# Patient Record
Sex: Female | Born: 1995
Health system: Southern US, Community
[De-identification: ages and names within clinical notes are randomized; demographics above are authoritative.]

## PROBLEM LIST (undated history)

## (undated) ENCOUNTER — Emergency Department (HOSPITAL_COMMUNITY): Discharge status: Absent without leave | Payer: Medicaid Other

## (undated) DIAGNOSIS — Z309 Encounter for contraceptive management, unspecified: Secondary | ICD-10-CM

## (undated) DIAGNOSIS — T7840XA Allergy, unspecified, initial encounter: Secondary | ICD-10-CM

## (undated) DIAGNOSIS — B999 Unspecified infectious disease: Secondary | ICD-10-CM

## (undated) HISTORY — DX: Encounter for contraceptive management, unspecified: Z30.9

## (undated) HISTORY — PX: WISDOM TOOTH EXTRACTION: SHX21

## (undated) HISTORY — DX: Allergy, unspecified, initial encounter: T78.40XA

---
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
## 2016-02-14 ENCOUNTER — Emergency Department (HOSPITAL_COMMUNITY)
Admission: EM | Admit: 2016-02-14 | Discharge: 2016-02-14 | Disposition: A | Discharge status: Home / Self Care | Payer: Self-pay | Attending: Emergency Medicine | Admitting: Emergency Medicine

## 2016-02-14 ENCOUNTER — Emergency Department (HOSPITAL_COMMUNITY): Payer: Self-pay

## 2016-02-14 ENCOUNTER — Telehealth: Payer: Self-pay | Admitting: Pediatrics

## 2016-02-14 ENCOUNTER — Encounter (HOSPITAL_COMMUNITY): Payer: Self-pay | Admitting: Emergency Medicine

## 2016-02-14 ENCOUNTER — Other Ambulatory Visit: Payer: Self-pay | Admitting: Pediatrics

## 2016-02-14 DIAGNOSIS — Z91010 Allergy to peanuts: Secondary | ICD-10-CM | POA: Insufficient documentation

## 2016-02-14 DIAGNOSIS — Z79899 Other long term (current) drug therapy: Secondary | ICD-10-CM | POA: Insufficient documentation

## 2016-02-14 DIAGNOSIS — J45901 Unspecified asthma with (acute) exacerbation: Secondary | ICD-10-CM | POA: Insufficient documentation

## 2016-02-14 DIAGNOSIS — Z87891 Personal history of nicotine dependence: Secondary | ICD-10-CM | POA: Insufficient documentation

## 2016-02-14 MED ORDER — PREDNISONE 20 MG PO TABS: 40.0000 mg | ORAL_TABLET | Freq: Every day | ORAL | 0 refills | Status: DC

## 2016-02-14 MED ORDER — ALBUTEROL SULFATE (2.5 MG/3ML) 0.083% IN NEBU
5.0000 mg | INHALATION_SOLUTION | Freq: Once | RESPIRATORY_TRACT | Status: AC
  Administered 2016-02-14: 5 mg via RESPIRATORY_TRACT
  Filled 2016-02-14: qty 6

## 2016-02-14 MED ORDER — ALBUTEROL SULFATE HFA 108 (90 BASE) MCG/ACT IN AERS
2.0000 | INHALATION_SPRAY | RESPIRATORY_TRACT | Status: DC | PRN
  Administered 2016-02-14: 2 via RESPIRATORY_TRACT
  Filled 2016-02-14: qty 6.7

## 2016-02-14 MED ORDER — ALBUTEROL SULFATE HFA 108 (90 BASE) MCG/ACT IN AERS: 1.0000 | INHALATION_SPRAY | Freq: Four times a day (QID) | RESPIRATORY_TRACT | 0 refills | Status: DC | PRN

## 2016-02-14 MED ORDER — ALBUTEROL SULFATE (2.5 MG/3ML) 0.083% IN NEBU: 2.5000 mg | INHALATION_SOLUTION | Freq: Four times a day (QID) | RESPIRATORY_TRACT | 0 refills | Status: DC | PRN

## 2016-02-14 MED ORDER — IPRATROPIUM BROMIDE 0.02 % IN SOLN
0.5000 mg | Freq: Once | RESPIRATORY_TRACT | Status: AC
  Administered 2016-02-14: 0.5 mg via RESPIRATORY_TRACT
  Filled 2016-02-14: qty 2.5

## 2016-02-14 MED ORDER — PREDNISONE 10 MG PO TABS
60.0000 mg | ORAL_TABLET | Freq: Once | ORAL | Status: AC
  Administered 2016-02-14: 60 mg via ORAL
  Filled 2016-02-14: qty 1

## 2016-02-14 MED ORDER — IPRATROPIUM-ALBUTEROL 0.5-2.5 (3) MG/3ML IN SOLN
RESPIRATORY_TRACT | Status: AC
  Administered 2016-02-14: 3 mL
  Filled 2016-02-14: qty 3

## 2016-02-14 NOTE — Telephone Encounter (Signed)
Refill sent. Left message to advise pt.

## 2016-02-14 NOTE — Telephone Encounter (Signed)
Pt has not been seen since 02/2015. Please advise on refill?

## 2016-02-14 NOTE — Discharge Instructions (Signed)
Your xray was negative for pneumonia. I have given you a prescription for prednisone to take once a day for the next 4 days. Use albuterol inhaler as needed. Follow up with primary care doctor. Return to the ED for symptoms worsen.

## 2016-02-14 NOTE — ED Provider Notes (Signed)
[facility]-EMERGENCY DEPT Provider Note   CSN: [id number] Arrival date & time: 02/14/16  1323     History   Chief Complaint Chief Complaint  Patient presents with  . asthma exacerbation    HPI Elizabeth Stuart is a 20 y.o. female.  20 year old African-American female past medical history significant for asthma presents to the ED today with an asthma exacerbation. 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 States she felt her wheezing worsened since this morning. Patient states that she is using her inhaler at home because she ran out and has not been able to get a prescription refilled. She also reports a mild productive cough Along with nasal congestion and rhinorrhea. Several sick contacts in the house. She denies any fevers, chest pain, shortness of breath, abdominal pain, nausea, emesis, urinary symptoms, change in bowel habits, numbness/stealing.       Past Medical History:  Diagnosis Date  . Allergy   . Asthma   . Contraceptive management 01/07/2015    Patient Active Problem List   Diagnosis Date Noted  . Mild intermittent asthma 02/11/2015  . Allergic rhinitis due to pollen 02/11/2015  . Oral allergy syndrome 02/11/2015  . Contraceptive management 01/07/2015  . Dyspnea 12/24/2012  . Hypoxemia 12/24/2012  . Caregiver smokes indoors and in family car 12/24/2012  . Mild intermittent asthma with acute exacerbation in pediatric patient 12/24/2012    History reviewed. No pertinent surgical history.  OB History    Gravida Para Term Preterm AB Living   0 0 0 0 0 0   SAB TAB Ectopic Multiple Live Births   0 0 0 0         Home Medications    Prior to Admission medications   Medication Sig Start Date End Date Taking? Authorizing Provider  benzonatate (TESSALON) 100 MG capsule Take 1 capsule (100 mg total) by mouth every 8 (eight) hours. 06/22/15   [name],  MD  EPINEPHrine (EPIPEN IJ) Inject 0.3 mg as directed as needed (FOR SEVERE LIFE-THREATENING ALLERGIC REACTION). Reported on 05/01/2015    Historical Provider, MD  fluticasone (FLONASE) 50 MCG/ACT nasal spray Place 1 spray into both nostrils daily.    Historical Provider, MD  ibuprofen (ADVIL,MOTRIN) 800 MG tablet Take 1 tablet (800 mg total) by mouth 3 (three) times daily. 08/13/15   Tammy Triplett, PA-C  magic mouthwash w/lidocaine SOLN 5 ml po swish and spit, do not swallow 08/13/15   Tammy Triplett, PA-C  montelukast (SINGULAIR) 10 MG tablet Take 10 mg by mouth daily.     Historical Provider, MD  norgestrel-ethinyl estradiol (LO/OVRAL,CRYSELLE) 0.3-30 MG-MCG tablet Take 1 tablet by mouth at bedtime.    Historical Provider, MD  predniSONE (DELTASONE) 20 MG tablet Take 2 tablets (40 mg total) by mouth daily. 06/22/15   [name], MD  PROAIR HFA 108 [phone] Base) MCG/ACT inhaler INHALE TWO PUFFS BY MOUTH EVERY 4 TO 6 HOURS AS NEEDED FOR COUGH OR WHEEZING 10/21/15   Fletcher Anon, MD  pseudoephedrine (SUDAFED) 60 MG tablet Take 1 tablet (60 mg total) by mouth every 6 (six) hours as needed for congestion. 08/13/15   Tammy Triplett, PA-C    Family History Family History  Problem Relation Age of Onset  . Diabetes Sister   . Diabetes Brother   . Asthma Brother   . Cancer Father     colon,  lung  . Hypertension Maternal Grandmother   . Diabetes Maternal Grandmother   . Arthritis Maternal Grandmother   . Hypertension Maternal Grandfather     Social History Social History  Substance Use Topics  . Smoking status: Former Smoker    Types: Cigars    Quit date: 01/06/2015  . Smokeless tobacco: Never Used  . Alcohol use No     Allergies   Dust mite extract; Other; Peanuts [peanut oil]; and Tomato   Review of Systems Review of Systems  Constitutional: Negative for chills and fever.  HENT: Positive for congestion, sinus pain, sinus pressure and sore throat.   Eyes: Negative.   Respiratory:  Positive for cough and wheezing. Negative for shortness of breath.   Cardiovascular: Negative for chest pain and palpitations.  Gastrointestinal: Negative for abdominal pain.  Musculoskeletal: Negative.   Skin: Negative.   Neurological: Negative for headaches.  All other systems reviewed and are negative.    Physical Exam Updated Vital Signs BP 98/81 (BP Location: Right Arm)   Pulse 89   Temp 98.3 F (36.8 C) (Oral)   Resp 20   Ht 5\' 3"  (1.6 m)   Wt 83.9 kg   LMP 02/10/2016   SpO2 98%   BMI 32.77 kg/m   Physical Exam  Constitutional: She is oriented to person, place, and time. She appears well-developed and well-nourished. No distress.  HENT:  Head: Normocephalic and atraumatic.  Right Ear: Tympanic membrane, external ear and ear canal normal.  Left Ear: Tympanic membrane, external ear and ear canal normal.  Nose: Mucosal edema and rhinorrhea present.  Mouth/Throat: Uvula is midline, oropharynx is clear and moist and mucous membranes are normal.  Eyes: Conjunctivae are normal. Right eye exhibits no discharge. Left eye exhibits no discharge. No scleral icterus.  Neck: Normal range of motion. Neck supple. No thyromegaly present.  Cardiovascular: Normal rate, regular rhythm, normal heart sounds and intact distal pulses.  Exam reveals no gallop and no friction rub.   No murmur heard. Pulmonary/Chest: Effort normal. No accessory muscle usage. No tachypnea. No respiratory distress. She has decreased breath sounds. She has wheezes. She has no rhonchi. She has no rales.  The patient is not hypoxic.  Abdominal: Soft. Bowel sounds are normal. She exhibits no distension. There is no tenderness. There is rebound and guarding.  Musculoskeletal: Normal range of motion.  Lymphadenopathy:    She has no cervical adenopathy.  Neurological: She is alert and oriented to person, place, and time.  Skin: Skin is warm and dry. Capillary refill takes less than 2 seconds.  Nursing note and vitals  reviewed.    ED Treatments / Results  Labs (all labs ordered are listed, but only abnormal results are displayed) Labs Reviewed - No data to display  EKG  EKG Interpretation None       Radiology No results found.  Procedures Procedures (including critical care time)  Medications Ordered in ED Medications  albuterol (PROVENTIL) (2.5 MG/3ML) 0.083% nebulizer solution 5 mg (5 mg Nebulization Given 02/14/16 1425)  ipratropium (ATROVENT) nebulizer solution 0.5 mg (0.5 mg Nebulization Given 02/14/16 1425)  ipratropium-albuterol (DUONEB) 0.5-2.5 (3) MG/3ML nebulizer solution (3 mLs  Given 02/14/16 1348)  predniSONE (DELTASONE) tablet 60 mg (60 mg Oral Given 02/14/16 1405)     Initial Impression / Assessment and Plan / ED Course  I have reviewed the triage vital signs and the nursing notes.  Pertinent labs & imaging results that were available during my care of the patient were reviewed  by me and considered in my medical decision making (see chart for details).  Clinical Course   Patient ambulated in ED with O2 saturations maintained >90, no current signs of respiratory distress. Lung exam improved after nebulizer treatment. Prednisone given in the ED and pt will bd dc with 5 day burst. Pt states they are breathing at baseline. Chest xray without acute infiltrate. Pt has been instructed to continue using prescribed medications and to speak with PCP about today's exacerbation. She was given prescription for home meds. Pt is hemodynamically stable, in NAD, & able to ambulate in the ED. Pt has no complaints prior to dc. Pt is comfortable with above plan and is stable for discharge at this time. All questions were answered prior to disposition. Strict return precautions for f/u to the ED were discussed.   Final Clinical Impressions(s) / ED Diagnoses   Final diagnoses:  Moderate asthma with exacerbation, unspecified whether persistent    New Prescriptions Discharge Medication List  as of 02/14/2016  3:17 PM    START taking these medications   Details  albuterol (PROVENTIL) (2.5 MG/3ML) 0.083% nebulizer solution Take 3 mLs (2.5 mg total) by nebulization every 6 (six) hours as needed for wheezing or shortness of breath., Starting Fri 02/14/2016, Print    predniSONE (DELTASONE) 20 MG tablet Take 2 tablets (40 mg total) by mouth daily with breakfast., Starting Fri 02/14/2016, Print         Rise MuKenneth T Syrenity Klepacki, PA-C 02/17/16 1101    Heide Scaleshristopher J Tegeler, MD 02/17/16 2204

## 2016-02-14 NOTE — ED Triage Notes (Signed)
Pt reports hx of asthma, onset of symptoms this am. Pt has ins and exp wheezing VSS. Pt reports she has not used any medications PTA, [facility]

## 2016-02-14 NOTE — Telephone Encounter (Signed)
She needs a refill sent in for her Proair Inhaler. [place] [facility] [place]. I told her that she needed an appointment but she says she cant schedule an appointment right now as she no longer has insurance and cant afford to pay for the visit out of pocket. Is there anything that can be done? Please contact her

## 2016-02-14 NOTE — Telephone Encounter (Signed)
Give her 1 refill

## 2016-05-29 ENCOUNTER — Emergency Department (HOSPITAL_COMMUNITY)
Admission: EM | Admit: 2016-05-29 | Discharge: 2016-05-29 | Disposition: A | Discharge status: Home / Self Care | Payer: Medicaid Other | Attending: Emergency Medicine | Admitting: Emergency Medicine

## 2016-05-29 ENCOUNTER — Encounter (HOSPITAL_COMMUNITY): Payer: Self-pay

## 2016-05-29 DIAGNOSIS — J45909 Unspecified asthma, uncomplicated: Secondary | ICD-10-CM | POA: Insufficient documentation

## 2016-05-29 DIAGNOSIS — R079 Chest pain, unspecified: Secondary | ICD-10-CM | POA: Insufficient documentation

## 2016-05-29 DIAGNOSIS — Z87891 Personal history of nicotine dependence: Secondary | ICD-10-CM | POA: Insufficient documentation

## 2016-05-29 DIAGNOSIS — Z5321 Procedure and treatment not carried out due to patient leaving prior to being seen by health care provider: Secondary | ICD-10-CM | POA: Insufficient documentation

## 2016-05-29 DIAGNOSIS — Z79899 Other long term (current) drug therapy: Secondary | ICD-10-CM | POA: Insufficient documentation

## 2016-05-29 NOTE — ED Triage Notes (Signed)
Pt had just drank an energy drink when she had onset of central chest pains that happened for 10 minutes x 4 episodes, denies pain at this time.

## 2016-05-29 NOTE — ED Provider Notes (Signed)
  No one in room 05:25 AM   EKG Interpretation  Date/Time:  Friday May 29 2016 02:14:52 EST Ventricular Rate:  74 PR Interval:    QRS Duration: 81 QT Interval:  352 QTC Calculation: 391 R Axis:   94 Text Interpretation:  Sinus rhythm Borderline right axis deviation Low voltage, precordial leads Since last tracing rate slower 04 Jul 2014 Confirmed by [facility] [id number]  MD-I, Harrell Niehoff ([id number]) on 05/29/2016 2:54:23 AM         Devoria AlbeIva Giordano Getman, MD 05/29/16 [phone]

## 2016-05-29 NOTE — ED Notes (Signed)
Pt not in room and clothing is gone.

## 2016-06-19 ENCOUNTER — Encounter (HOSPITAL_COMMUNITY): Payer: Self-pay

## 2016-06-19 ENCOUNTER — Emergency Department (HOSPITAL_COMMUNITY)
Admission: EM | Admit: 2016-06-19 | Discharge: 2016-06-19 | Disposition: A | Discharge status: Home / Self Care | Payer: Medicaid Other | Attending: Emergency Medicine | Admitting: Emergency Medicine

## 2016-06-19 DIAGNOSIS — Z79899 Other long term (current) drug therapy: Secondary | ICD-10-CM | POA: Insufficient documentation

## 2016-06-19 DIAGNOSIS — Z87891 Personal history of nicotine dependence: Secondary | ICD-10-CM | POA: Insufficient documentation

## 2016-06-19 DIAGNOSIS — J45909 Unspecified asthma, uncomplicated: Secondary | ICD-10-CM | POA: Insufficient documentation

## 2016-06-19 DIAGNOSIS — J020 Streptococcal pharyngitis: Secondary | ICD-10-CM

## 2016-06-19 LAB — RAPID STREP SCREEN (MED CTR MEBANE ONLY): STREPTOCOCCUS, GROUP A SCREEN (DIRECT): POSITIVE — AB

## 2016-06-19 MED ORDER — PENICILLIN G BENZATHINE 1200000 UNIT/2ML IM SUSP
1.2000 10*6.[IU] | Freq: Once | INTRAMUSCULAR | Status: AC
  Administered 2016-06-19: 1.2 10*6.[IU] via INTRAMUSCULAR
  Filled 2016-06-19: qty 2

## 2016-06-19 NOTE — ED Triage Notes (Signed)
Sore throat x 2 days, swollen lymph nodes in neck.

## 2016-06-19 NOTE — Discharge Instructions (Signed)
Keep yourself hydrated. Follow up with your doctor. Use tylenol or ibuprofen as needed for pain and fever. Return to the ED if you develop new or worsening symptoms.

## 2016-06-19 NOTE — ED Provider Notes (Signed)
[facility]-EMERGENCY DEPT Provider Note   CSN: [id number] Arrival date & time: 06/19/16  0355     History   Chief Complaint Chief Complaint  Patient presents with  . Sore Throat    HPI Elizabeth Stuart is a 21 y.o. female.  Patient presents with a 2 day history of sore throat and pain with swallowing. Also feel swelling in her neck. Denies fever. Sick contacts with strep at home. No nausea or vomiting. Does have history of asthma. Denies any cough or fever. No abdominal pain, nausea, vomiting or diarrhea. No chest pain or shortness of breath. Good by mouth intake and urine output.   The history is provided by the patient.  Sore Throat  Pertinent negatives include no chest pain, no abdominal pain, no headaches and no shortness of breath.    Past Medical History:  Diagnosis Date  . Allergy   . Asthma   . Contraceptive management 01/07/2015    Patient Active Problem List   Diagnosis Date Noted  . Mild intermittent asthma 02/11/2015  . Allergic rhinitis due to pollen 02/11/2015  . Oral allergy syndrome 02/11/2015  . Contraceptive management 01/07/2015  . Dyspnea 12/24/2012  . Hypoxemia 12/24/2012  . Caregiver smokes indoors and in family car 12/24/2012  . Mild intermittent asthma with acute exacerbation in pediatric patient 12/24/2012    History reviewed. No pertinent surgical history.  OB History    Gravida Para Term Preterm AB Living   0 0 0 0 0 0   SAB TAB Ectopic Multiple Live Births   0 0 0 0         Home Medications    Prior to Admission medications   Medication Sig Start Date End Date Taking? Authorizing Provider  albuterol (PROAIR HFA) 108 (90 Base) MCG/ACT inhaler Inhale 1-2 puffs into the lungs every 6 (six) hours as needed for wheezing or shortness of breath. 02/14/16  Yes Fletcher Anon, MD  albuterol (PROVENTIL) (2.5 MG/3ML) 0.083% nebulizer solution Take 3 mLs (2.5 mg total) by nebulization every 6 (six) hours as needed for wheezing or shortness of  breath. 02/14/16  Yes Iantha Fallen T Leaphart, PA-C  EPINEPHrine (EPIPEN IJ) Inject 0.3 mg as directed as needed (FOR SEVERE LIFE-THREATENING ALLERGIC REACTION). Reported on 05/01/2015   Yes Historical Provider, MD  fluticasone (FLONASE) 50 MCG/ACT nasal spray Place 1 spray into both nostrils daily.   Yes Historical Provider, MD  predniSONE (DELTASONE) 20 MG tablet Take 2 tablets (40 mg total) by mouth daily with breakfast. 02/14/16   Rise Mu, PA-C    Family History Family History  Problem Relation Age of Onset  . Diabetes Sister   . Diabetes Brother   . Asthma Brother   . Cancer Father     colon, lung  . Hypertension Maternal Grandmother   . Diabetes Maternal Grandmother   . Arthritis Maternal Grandmother   . Hypertension Maternal Grandfather     Social History Social History  Substance Use Topics  . Smoking status: Former Smoker    Types: Cigars    Quit date: 01/06/2015  . Smokeless tobacco: Never Used  . Alcohol use No     Allergies   Dust mite extract; Other; Peanuts [peanut oil]; and Tomato   Review of Systems Review of Systems  Constitutional: Negative for activity change, appetite change, chills and fever.  HENT: Positive for sore throat. Negative for congestion, facial swelling and trouble swallowing.   Respiratory: Negative for cough and shortness of breath.  Cardiovascular: Negative for chest pain.  Gastrointestinal: Negative for abdominal pain, nausea and vomiting.  Genitourinary: Negative for dysuria and hematuria.  Musculoskeletal: Negative for arthralgias and myalgias.  Neurological: Negative for dizziness, weakness, light-headedness and headaches.   A complete 10 system review of systems was obtained and all systems are negative except as noted in the HPI and PMH.    Physical Exam Updated Vital Signs BP 132/77 (BP Location: Right Arm)   Pulse 81   Temp 98.3 F (36.8 C) (Oral)   Resp 17   Ht 5\' 4"  (1.626 m)   Wt 185 lb (83.9 kg)   SpO2 99%    BMI 31.76 kg/m   Physical Exam  Constitutional: She is oriented to person, place, and time. She appears well-developed and well-nourished. No distress.  HENT:  Head: Normocephalic and atraumatic.  Mouth/Throat: Oropharynx is clear and moist. No oropharyngeal exudate.  Erythematous oropharynx. Tonsils symmetric. No asymmetry. No exudates. Floor of mouth soft.  Eyes: Conjunctivae and EOM are normal. Pupils are equal, round, and reactive to light.  Neck: Normal range of motion. Neck supple.  No meningismus.  Cardiovascular: Normal rate, regular rhythm, normal heart sounds and intact distal pulses.   No murmur heard. Pulmonary/Chest: Effort normal and breath sounds normal. No respiratory distress.  Abdominal: Soft. There is no tenderness. There is no rebound and no guarding.  Musculoskeletal: Normal range of motion. She exhibits no edema or tenderness.  Lymphadenopathy:    She has cervical adenopathy.  Neurological: She is alert and oriented to person, place, and time. No cranial nerve deficit. She exhibits normal muscle tone. Coordination normal.   5/5 strength throughout. CN 2-12 intact.Equal grip strength.   Skin: Skin is warm.  Psychiatric: She has a normal mood and affect. Her behavior is normal.  Nursing note and vitals reviewed.    ED Treatments / Results  Labs (all labs ordered are listed, but only abnormal results are displayed) Labs Reviewed  RAPID STREP SCREEN (NOT AT [facility]) - Abnormal; Notable for the following:       Result Value   Streptococcus, Group A Screen (Direct) POSITIVE (*)    All other components within normal limits    EKG  EKG Interpretation None       Radiology No results found.  Procedures Procedures (including critical care time)  Medications Ordered in ED Medications  penicillin g benzathine (BICILLIN LA) 1200000 UNIT/2ML injection 1.2 Million Units (not administered)     Initial Impression / Assessment and Plan / ED Course  I have  reviewed the triage vital signs and the nursing notes.  Pertinent labs & imaging results that were available during my care of the patient were reviewed by me and considered in my medical decision making (see chart for details).     Sore throat with difficulty swallowing for the past 2 days. No evidence of RPA or peritonsillar abscess on exam. Patient tolerating secretions without difficulty.  She elects IM bicillin.  Patient treated with IM Bicillin. No difficulty breathing or swallowing. Follow-up with PCP. Return precautions discussed.  Final Clinical Impressions(s) / ED Diagnoses   Final diagnoses:  Strep pharyngitis    New Prescriptions New Prescriptions   No medications on file     Glynn OctaveStephen Leshia Kope, MD 06/19/16 [phone]

## 2016-06-19 NOTE — ED Notes (Signed)
Pt ambulatory to waiting room. Pt verbalized understanding of discharge instructions.   

## 2016-09-14 ENCOUNTER — Emergency Department: Payer: Medicaid Other

## 2016-09-14 ENCOUNTER — Emergency Department
Admission: EM | Admit: 2016-09-14 | Discharge: 2016-09-14 | Discharge status: Against Medical Advice | Payer: Medicaid Other | Attending: Emergency Medicine | Admitting: Emergency Medicine

## 2016-09-14 ENCOUNTER — Encounter: Payer: Self-pay | Admitting: Emergency Medicine

## 2016-09-14 DIAGNOSIS — Z5321 Procedure and treatment not carried out due to patient leaving prior to being seen by health care provider: Secondary | ICD-10-CM | POA: Insufficient documentation

## 2016-09-14 DIAGNOSIS — R062 Wheezing: Secondary | ICD-10-CM | POA: Insufficient documentation

## 2016-09-14 MED ORDER — ALBUTEROL SULFATE (2.5 MG/3ML) 0.083% IN NEBU
5.0000 mg | INHALATION_SOLUTION | Freq: Once | RESPIRATORY_TRACT | Status: AC
  Administered 2016-09-14: 5 mg via RESPIRATORY_TRACT
  Filled 2016-09-14: qty 6

## 2016-09-14 NOTE — ED Triage Notes (Signed)
Pt to triage in wheelchair. Pt c/o SOB and difficulty breathing since 2200 Sunday. Pt has asthma and reports she has not been able to get medications due to cost. Pt has inspiratory and expiratory wheezing in bilateral lung fields.

## 2017-04-08 ENCOUNTER — Encounter (HOSPITAL_COMMUNITY): Payer: Self-pay | Admitting: *Deleted

## 2017-04-08 ENCOUNTER — Emergency Department (HOSPITAL_COMMUNITY): Payer: Self-pay

## 2017-04-08 ENCOUNTER — Emergency Department (HOSPITAL_COMMUNITY)
Admission: EM | Admit: 2017-04-08 | Discharge: 2017-04-08 | Disposition: A | Discharge status: Home / Self Care | Payer: Self-pay | Attending: Emergency Medicine | Admitting: Emergency Medicine

## 2017-04-08 DIAGNOSIS — Z79899 Other long term (current) drug therapy: Secondary | ICD-10-CM | POA: Insufficient documentation

## 2017-04-08 DIAGNOSIS — Z87891 Personal history of nicotine dependence: Secondary | ICD-10-CM | POA: Insufficient documentation

## 2017-04-08 DIAGNOSIS — Z91010 Allergy to peanuts: Secondary | ICD-10-CM | POA: Insufficient documentation

## 2017-04-08 DIAGNOSIS — J45901 Unspecified asthma with (acute) exacerbation: Secondary | ICD-10-CM | POA: Insufficient documentation

## 2017-04-08 MED ORDER — ALBUTEROL SULFATE HFA 108 (90 BASE) MCG/ACT IN AERS
2.0000 | INHALATION_SPRAY | Freq: Once | RESPIRATORY_TRACT | Status: AC
  Administered 2017-04-08: 2 via RESPIRATORY_TRACT
  Filled 2017-04-08: qty 6.7

## 2017-04-08 MED ORDER — PREDNISONE 20 MG PO TABS
40.0000 mg | ORAL_TABLET | Freq: Once | ORAL | Status: AC
  Administered 2017-04-08: 40 mg via ORAL
  Filled 2017-04-08: qty 2

## 2017-04-08 MED ORDER — PREDNISONE 20 MG PO TABS: 40.0000 mg | ORAL_TABLET | Freq: Every day | ORAL | 0 refills | Status: DC

## 2017-04-08 MED ORDER — ALBUTEROL SULFATE (2.5 MG/3ML) 0.083% IN NEBU
5.0000 mg | INHALATION_SOLUTION | Freq: Once | RESPIRATORY_TRACT | Status: AC
  Administered 2017-04-08: 5 mg via RESPIRATORY_TRACT
  Filled 2017-04-08: qty 6

## 2017-04-08 NOTE — ED Triage Notes (Signed)
Pt says tonight she is having wheezing and tightness in her chest. She states she does not have an inhaler.

## 2017-04-08 NOTE — Discharge Instructions (Signed)
1-2 puffs of the inhaler every 4-6 hrs as needed.  Start the prednisone prescription tomorrow.  Return here for any worsening symptoms

## 2017-04-09 NOTE — ED Provider Notes (Signed)
[facility] EMERGENCY DEPART[facility] Provider Note   CSN: [id number] Arrival date & time: 04/08/17  2120     History   Chief Complaint Chief Complaint  Patient presents with  . Asthma    HPI Elizabeth ConstantKiara A Stuart is a 22 y.o. female.  HPI  Elizabeth Stuart is a 22 y.o. female who presents to the Emergency Department complaining of cough, wheezing and chest tightness.  Symptoms present for one day.  Has hx of asthma and was exposed to chemicals one day ago and symptoms began shortly after that.  No longer has an inhaler.  Symptoms are worse with exertion.  Denies fever, chills, shortness of breath.  She has not tried any medications or therapies for symptom relief.  Past Medical History:  Diagnosis Date  . Allergy   . Asthma   . Contraceptive management 01/07/2015    Patient Active Problem List   Diagnosis Date Noted  . Mild intermittent asthma 02/11/2015  . Allergic rhinitis due to pollen 02/11/2015  . Oral allergy syndrome 02/11/2015  . Contraceptive management 01/07/2015  . Dyspnea 12/24/2012  . Hypoxemia 12/24/2012  . Caregiver smokes indoors and in family car 12/24/2012  . Mild intermittent asthma with acute exacerbation in pediatric patient 12/24/2012    History reviewed. No pertinent surgical history.  OB History    Gravida Para Term Preterm AB Living   0 0 0 0 0 0   SAB TAB Ectopic Multiple Live Births   0 0 0 0         Home Medications    Prior to Admission medications   Medication Sig Start Date End Date Taking? Authorizing Provider  albuterol (PROAIR HFA) 108 (90 Base) MCG/ACT inhaler Inhale 1-2 puffs into the lungs every 6 (six) hours as needed for wheezing or shortness of breath. 02/14/16   Fletcher AnonBardelas, Jose A, MD  albuterol (PROVENTIL) (2.5 MG/3ML) 0.083% nebulizer solution Take 3 mLs (2.5 mg total) by nebulization every 6 (six) hours as needed for wheezing or shortness of breath. 02/14/16   Rise MuLeaphart, Kenneth T, PA-C  EPINEPHrine (EPIPEN IJ) Inject 0.3 mg as  directed as needed (FOR SEVERE LIFE-THREATENING ALLERGIC REACTION). Reported on 05/01/2015    [provider]  fluticasone (FLONASE) 50 MCG/ACT nasal spray Place 1 spray into both nostrils daily.    [provider]  predniSONE (DELTASONE) 20 MG tablet Take 2 tablets (40 mg total) by mouth daily. 04/08/17   Pauline Ausriplett, Vanilla [place], PA-C    Family History Family History  Problem Relation Age of Onset  . Diabetes Sister   . Diabetes Brother   . Asthma Brother   . Cancer Father        colon, lung  . Hypertension Maternal Grandmother   . Diabetes Maternal Grandmother   . Arthritis Maternal Grandmother   . Hypertension Maternal Grandfather     Social History Social History   Tobacco Use  . Smoking status: Former Smoker    Types: Cigars    Last attempt to quit: 01/06/2015    Years since quitting: 2.2  . Smokeless tobacco: Never Used  Substance Use Topics  . Alcohol use: No    Alcohol/week: 0.0 oz  . Drug use: No     Allergies   Dust mite extract; Other; Peanuts [peanut oil]; and Tomato   Review of Systems Review of Systems  Constitutional: Negative for appetite change, chills and fever.  HENT: Negative for congestion, sore throat and trouble swallowing.   Respiratory: Positive for cough, chest tightness  and wheezing. Negative for shortness of breath.   Cardiovascular: Negative for chest pain and leg swelling.  Gastrointestinal: Negative for abdominal pain, nausea and vomiting.  Genitourinary: Negative for dysuria.  Musculoskeletal: Negative for arthralgias.  Skin: Negative for rash.  Neurological: Negative for dizziness, weakness and numbness.  Hematological: Negative for adenopathy.  All other systems reviewed and are negative.    Physical Exam Updated Vital Signs BP 133/90   Pulse 98   Temp 97.8 F (36.6 C) (Oral)   Resp 20   Ht 5\' 4"  (1.626 m)   Wt 81.6 kg (180 lb)   SpO2 98%   BMI 30.90 kg/m   Physical Exam  Constitutional: She is oriented to  person, place, and time. She appears well-developed and well-nourished. No distress.  HENT:  Head: Normocephalic and atraumatic.  Right Ear: Tympanic membrane and ear canal normal.  Left Ear: Tympanic membrane and ear canal normal.  Mouth/Throat: Uvula is midline, oropharynx is clear and moist and mucous membranes are normal. No oropharyngeal exudate.  Eyes: EOM are normal. Pupils are equal, round, and reactive to light.  Neck: Normal range of motion, full passive range of motion without pain and phonation normal. Neck supple.  Cardiovascular: Normal rate, regular rhythm and intact distal pulses.  No murmur heard. Pulmonary/Chest: Effort normal. No stridor. No respiratory distress. She has wheezes. She has no rales. She exhibits no tenderness.  Diminished lung sounds with scattered inspiratory wheezes.  Pt able to speak in clear, full sentences w/o distress  Musculoskeletal: She exhibits no edema.  Lymphadenopathy:    She has no cervical adenopathy.  Neurological: She is alert and oriented to person, place, and time. She exhibits normal muscle tone. Coordination normal.  Skin: Skin is warm and dry.  Nursing note and vitals reviewed.    ED Treatments / Results  Labs (all labs ordered are listed, but only abnormal results are displayed) Labs Reviewed - No data to display  EKG  EKG Interpretation None       Radiology Dg Chest 2 View  Result Date: 04/08/2017 CLINICAL DATA:  Wheezing and shortness of breath after breathing hair dye. History of asthma. EXAM: CHEST  2 VIEW COMPARISON:  09/14/2016 FINDINGS: Normal heart size and mediastinal contours. No acute infiltrate or edema. No effusion or pneumothorax. No acute osseous findings. IMPRESSION: Negative chest. Electronically Signed   By: Marnee Spring M.D.   On: 04/08/2017 22:17    Procedures Procedures (including critical care time)  Medications Ordered in ED Medications  albuterol (PROVENTIL) (2.5 MG/3ML) 0.083% nebulizer  solution 5 mg (5 mg Nebulization Given 04/08/17 2137)  albuterol (PROVENTIL HFA;VENTOLIN HFA) 108 (90 Base) MCG/ACT inhaler 2 puff (2 puffs Inhalation Given 04/08/17 2233)  predniSONE (DELTASONE) tablet 40 mg (40 mg Oral Given 04/08/17 2233)     Initial Impression / Assessment and Plan / ED Course  I have reviewed the triage vital signs and the nursing notes.  Pertinent labs & imaging results that were available during my care of the patient were reviewed by me and considered in my medical decision making (see chart for details).     Asthmatic pt with recent chemical exposure.  No respiratory distress. On recheck, lung sounds have improved after albuterol neb.  PERC negative.  Albuterol MDI dispensed.  Pt appears safe for d/c home, rx for steroid.  Return precautions discussed.   Final Clinical Impressions(s) / ED Diagnoses   Final diagnoses:  Mild asthma with exacerbation, unspecified whether persistent    ED  Discharge Orders        Ordered    predniSONE (DELTASONE) 20 MG tablet  Daily     04/08/17 2228       Pauline Aus, PA-C 04/09/17 1419    Mesner, Barbara Cower, MD 04/12/17 2120

## 2017-05-05 ENCOUNTER — Encounter (HOSPITAL_COMMUNITY): Payer: Self-pay | Admitting: Emergency Medicine

## 2017-05-05 ENCOUNTER — Other Ambulatory Visit: Payer: Self-pay

## 2017-05-05 ENCOUNTER — Emergency Department (HOSPITAL_COMMUNITY): Payer: Self-pay

## 2017-05-05 ENCOUNTER — Emergency Department (HOSPITAL_COMMUNITY)
Admission: EM | Admit: 2017-05-05 | Discharge: 2017-05-05 | Disposition: A | Discharge status: Home / Self Care | Payer: Self-pay | Attending: Emergency Medicine | Admitting: Emergency Medicine

## 2017-05-05 DIAGNOSIS — J189 Pneumonia, unspecified organism: Secondary | ICD-10-CM | POA: Insufficient documentation

## 2017-05-05 DIAGNOSIS — Z87891 Personal history of nicotine dependence: Secondary | ICD-10-CM | POA: Insufficient documentation

## 2017-05-05 DIAGNOSIS — Z79899 Other long term (current) drug therapy: Secondary | ICD-10-CM | POA: Insufficient documentation

## 2017-05-05 DIAGNOSIS — J45901 Unspecified asthma with (acute) exacerbation: Secondary | ICD-10-CM | POA: Insufficient documentation

## 2017-05-05 DIAGNOSIS — Z91010 Allergy to peanuts: Secondary | ICD-10-CM | POA: Insufficient documentation

## 2017-05-05 DIAGNOSIS — R0982 Postnasal drip: Secondary | ICD-10-CM | POA: Insufficient documentation

## 2017-05-05 LAB — I-STAT BETA HCG BLOOD, ED (MC, WL, AP ONLY): I-stat hCG, quantitative: <5 m[IU]/mL (ref <5)

## 2017-05-05 MED ORDER — PREDNISONE 50 MG PO TABS
60.0000 mg | ORAL_TABLET | Freq: Once | ORAL | Status: AC
  Administered 2017-05-05: 60 mg via ORAL
  Filled 2017-05-05: qty 1

## 2017-05-05 MED ORDER — IPRATROPIUM-ALBUTEROL 0.5-2.5 (3) MG/3ML IN SOLN: 3.0000 mL | Freq: Once | RESPIRATORY_TRACT | Status: DC

## 2017-05-05 MED ORDER — ALBUTEROL SULFATE (2.5 MG/3ML) 0.083% IN NEBU: 2.5000 mg | INHALATION_SOLUTION | Freq: Once | RESPIRATORY_TRACT | Status: DC

## 2017-05-05 MED ORDER — IPRATROPIUM-ALBUTEROL 0.5-2.5 (3) MG/3ML IN SOLN
3.0000 mL | Freq: Once | RESPIRATORY_TRACT | Status: AC
  Administered 2017-05-05: 3 mL via RESPIRATORY_TRACT
  Filled 2017-05-05: qty 3

## 2017-05-05 MED ORDER — FLUTICASONE PROPIONATE 50 MCG/ACT NA SUSP: 2.0000 | Freq: Every day | NASAL | 0 refills | Status: DC

## 2017-05-05 MED ORDER — ACETAMINOPHEN 325 MG PO TABS
650.0000 mg | ORAL_TABLET | Freq: Once | ORAL | Status: AC
  Administered 2017-05-05: 650 mg via ORAL
  Filled 2017-05-05: qty 2

## 2017-05-05 MED ORDER — PREDNISONE 20 MG PO TABS: 40.0000 mg | ORAL_TABLET | Freq: Every day | ORAL | 0 refills | Status: AC

## 2017-05-05 MED ORDER — ALBUTEROL SULFATE HFA 108 (90 BASE) MCG/ACT IN AERS: 1.0000 | INHALATION_SPRAY | Freq: Four times a day (QID) | RESPIRATORY_TRACT | 0 refills | Status: DC | PRN

## 2017-05-05 MED ORDER — AZITHROMYCIN 250 MG PO TABS: 250.0000 mg | ORAL_TABLET | Freq: Every day | ORAL | 0 refills | Status: DC

## 2017-05-05 NOTE — ED Notes (Signed)
RT called for tx 

## 2017-05-05 NOTE — ED Provider Notes (Signed)
[facility] EMERGENCY DEPARTMENT Provider Note   CSN: [id number] Arrival date & time: 05/05/17  0747     History   Chief Complaint Chief Complaint  Patient presents with  . Sore Throat    HPI Elizabeth Stuart is a 22 y.o. female.   HPI  22 y/o female with a h/o asthama who prsents to the ED c/o constant 6/10 left sided sore throat that began one week ago. States she has worse pain with swallowing, but that it is not difficult to swallow. Sore throat is worse in the morning. Has tried using cough drops and congestion medicine. Also c/o productive cough with yellow sputum, SOB, wheezing, rhinorrhea, congestion, and postnasal drip.  Has tried using her albuterol inhaler multiple times per day with no relief. She denies fevers, chills, HA, sinus pain/pressure, ear fullness/pain, chest pain, abd pain, NVD, constipation, urinary sxs. Denies calf pain, swelling, redness. Denies estrogen use, h/o blood clots or CA, recent surgeries/trauma, or recent periods of immobilization.  Was admitted for asthma exacerbation once in the past, but has never been intubated. Was recently seen on 1/3 for asthma exacerbation and was given an inhaler and prednisone burst. States she did not fill prednisone Rx. States she is almost out of her inhaler.  Past Medical History:  Diagnosis Date  . Allergy   . Asthma   . Contraceptive management 01/07/2015    Patient Active Problem List   Diagnosis Date Noted  . Mild intermittent asthma 02/11/2015  . Allergic rhinitis due to pollen 02/11/2015  . Oral allergy syndrome 02/11/2015  . Contraceptive management 01/07/2015  . Dyspnea 12/24/2012  . Hypoxemia 12/24/2012  . Caregiver smokes indoors and in family car 12/24/2012  . Mild intermittent asthma with acute exacerbation in pediatric patient 12/24/2012    History reviewed. No pertinent surgical history.  OB History    Gravida Para Term Preterm AB Living   0 0 0 0 0 0   SAB TAB Ectopic Multiple Live Births     0 0 0 0         Home Medications    Prior to Admission medications   Medication Sig Start Date End Date Taking? Authorizing Provider  albuterol (PROVENTIL HFA;VENTOLIN HFA) 108 (90 Base) MCG/ACT inhaler Inhale 1-2 puffs into the lungs every 6 (six) hours as needed for wheezing or shortness of breath. 05/05/17   Christine Schiefelbein S, PA-C  azithromycin (ZITHROMAX Z-PAK) 250 MG tablet Take 1 tablet (250 mg total) by mouth daily. Take 2 tablets on the first day of treatment. Then take 1 tablet per day for the next four days. 05/05/17   Siddhant Hashemi S, PA-C  EPINEPHrine (EPIPEN IJ) Inject 0.3 mg as directed as needed (FOR SEVERE LIFE-THREATENING ALLERGIC REACTION). Reported on 05/01/2015    [provider]  fluticasone (FLONASE) 50 MCG/ACT nasal spray Place 2 sprays into both nostrils daily. 05/05/17   Hridaan Bouse S, PA-C  predniSONE (DELTASONE) 20 MG tablet Take 2 tablets (40 mg total) by mouth daily for 5 days. 05/05/17 05/10/17  Merwin Breden S, PA-C  Norethindrone-Ethinyl Estradiol-Fe Biphas (LO LOESTRIN FE) 1 MG-10 MCG / 10 MCG tablet Take 1 tablet by mouth daily. Take 1 daily by mouth 01/07/15 05/01/15  Adline PotterGriffin, Jennifer A, NP    Family History Family History  Problem Relation Age of Onset  . Diabetes Sister   . Diabetes Brother   . Asthma Brother   . Cancer Father        colon, lung  .  Hypertension Maternal Grandmother   . Diabetes Maternal Grandmother   . Arthritis Maternal Grandmother   . Hypertension Maternal Grandfather     Social History Social History   Tobacco Use  . Smoking status: Former Smoker    Types: Cigars    Last attempt to quit: 01/06/2015    Years since quitting: 2.3  . Smokeless tobacco: Never Used  Substance Use Topics  . Alcohol use: No    Alcohol/week: 0.0 oz  . Drug use: No     Allergies   Dust mite extract; Other; Peanuts [peanut oil]; and Tomato   Review of Systems Review of Systems  Constitutional: Negative for chills and fever.   HENT: Positive for congestion, postnasal drip, rhinorrhea and sore throat. Negative for drooling, ear pain, sinus pressure, sinus pain and trouble swallowing.   Eyes: Negative for pain and visual disturbance.  Respiratory: Positive for cough, shortness of breath and wheezing.   Cardiovascular: Negative for chest pain, palpitations and leg swelling.  Gastrointestinal: Negative for abdominal pain, constipation, diarrhea, nausea and vomiting.  Genitourinary: Negative for dysuria, frequency, hematuria and urgency.  Musculoskeletal: Negative for arthralgias and back pain.  Skin: Negative for color change and rash.  Neurological: Negative for seizures and syncope.  All other systems reviewed and are negative.    Physical Exam Updated Vital Signs BP 126/88   Pulse (!) 111   Temp 99.5 F (37.5 C) (Oral)   Resp 18   SpO2 99%   Physical Exam  Constitutional: She appears well-developed and well-nourished.  Non-toxic appearance. She does not appear ill. No distress.  HENT:  Head: Normocephalic and atraumatic.  Mouth/Throat: Uvula is midline, oropharynx is clear and moist and mucous membranes are normal. Mucous membranes are not dry. No oral lesions. No uvula swelling. No oropharyngeal exudate, posterior oropharyngeal edema, posterior oropharyngeal erythema or tonsillar abscesses. Tonsils are 0 on the right. Tonsils are 0 on the left. No tonsillar exudate.  Eyes: Conjunctivae and EOM are normal. Pupils are equal, round, and reactive to light.  Neck: Normal range of motion. Neck supple.  Cardiovascular: Normal rate, regular rhythm, normal heart sounds and intact distal pulses. Exam reveals no friction rub.  No murmur heard. HR 98  Pulmonary/Chest: Effort normal. No stridor. No respiratory distress. She has wheezes (mild expiratory wheezes bilaterally). She has no rhonchi. She has no rales.  Speaking in full sentences. No respiratory distress or tachypnea.  Abdominal: Soft. Bowel sounds are  normal. She exhibits no distension and no mass. There is no tenderness. There is no guarding.  Musculoskeletal: She exhibits no edema.  Lymphadenopathy:    She has no cervical adenopathy.  Neurological: She is alert.  Skin: Skin is warm and dry.  Psychiatric: She has a normal mood and affect.  Nursing note and vitals reviewed.    ED Treatments / Results  Labs (all labs ordered are listed, but only abnormal results are displayed) Labs Reviewed  I-STAT BETA HCG BLOOD, ED ([facility], [facility], [facility] ONLY)    EKG  EKG Interpretation  Date/Time:  Wednesday May 05 2017 08:38:38 EST Ventricular Rate:  100 PR Interval:    QRS Duration: 80 QT Interval:  312 QTC Calculation: 403 R Axis:   50 Text Interpretation:  Sinus tachycardia nl intervals, no acute st/ts Confirmed by Meridee Score [phone]) on 05/05/2017 8:46:23 AM       Radiology Dg Chest 2 View  Result Date: 05/05/2017 CLINICAL DATA:  Sore throat for 1 week with fever and shortness of breath.  EXAM: CHEST  2 VIEW COMPARISON:  None. FINDINGS: Trachea is midline. Heart size normal. There may be a subtle peribronchovascular opacity in the peripheral right lower hemithorax. Lungs are hyperinflated but otherwise clear. IMPRESSION: Possible peribronchovascular opacification in the lower right hemithorax, which can be seen with infectious bronchiolitis or bronchopneumonia. Electronically Signed   By: Leanna Battles M.D.   On: 05/05/2017 09:12    Procedures Procedures (including critical care time)  Medications Ordered in ED Medications  ipratropium-albuterol (DUONEB) 0.5-2.5 (3) MG/3ML nebulizer solution 3 mL (3 mLs Nebulization Given 05/05/17 0854)  predniSONE (DELTASONE) tablet 60 mg (60 mg Oral Given 05/05/17 0844)  acetaminophen (TYLENOL) tablet 650 mg (650 mg Oral Given 05/05/17 0844)     Initial Impression / Assessment and Plan / ED Course  I have reviewed the triage vital signs and the nursing notes.  Pertinent labs & imaging  results that were available during my care of the patient were reviewed by me and considered in my medical decision making (see chart for details).  Clinical Course as of May 05 1416  Wed May 05, 2017  1133 22 year old female with history of asthma complaining of increased cough productive of sputum along with some left-sided throat pain.  She is tachycardic here with some scattered wheezes.  She is getting breathing treatments and some prednisone.  She likely will be discharged to follow-up with her primary care doctor.  [MB]    Clinical Course User Index [MB] Terrilee Files, MD    9:25 AM Rechecked pt. She feels that her breathing has improved since the nebulizer tx. Rare wheezes bilaterally. Good air movement throughout. She is satting at 99% on RA. She continues to deny chest pain. She is mildly tachycardic after the neb tx. Pt was ambulated with O2 monitoring and maintained sats to 99% on RA. Discussed results of CXR, EKG, and plan for dc with antibiotics, steroids, and flonase. Discussed importance of f/u with primary care to further eval asthma.   Final Clinical Impressions(s) / ED Diagnoses   Final diagnoses:  Mild asthma with exacerbation, unspecified whether persistent  Community acquired pneumonia, unspecified laterality  Post-nasal drainage    Patient ambulated in ED with O2 saturations maintained >90, no current signs of respiratory distress.  Pt states they are breathing at baseline. Lung exam improved after nebulizer treatment. Prednisone given in ED. Pt CXR with possible opacification in right hemithorax consistent with infectious bronchiolitis or bronchomphenumonia. Will treat patient for community acquired pneumonia with azithro. Pt will also be discharged with symptomatic treatment for rhinorrhea/congestion as well an additional albuterol inhaler and steroid burst for suspected concurrent asthma exacerbation. Pt also requested additional albuterol inhaler.  Verbalizes  understanding and is agreeable with plan. Pt is hemodynamically stable & in NAD prior to dc. Pt has been instructed to continue using prescribed medications and to speak with PCP about today's exacerbation. Pt mildly tachycardic, likely due to use of MDI inhaler and duo neb. No risk factors for PE. Wells for PE low risk.  ED Discharge Orders        Ordered    fluticasone (FLONASE) 50 MCG/ACT nasal spray  Daily     05/05/17 0911    predniSONE (DELTASONE) 20 MG tablet  Daily     05/05/17 0911    azithromycin (ZITHROMAX Z-PAK) 250 MG tablet  Daily     05/05/17 0932    albuterol (PROVENTIL HFA;VENTOLIN HFA) 108 (90 Base) MCG/ACT inhaler  Every 6 hours PRN  05/05/17 1191       Karrie Meres, PA-C 05/05/17 [address] Numair Masden S, PA-C 05/05/17 1419    Terrilee Files, MD 05/05/17 [phone]

## 2017-05-05 NOTE — ED Triage Notes (Addendum)
Sore throat x 1 week to left side. Nad. Prod cough with yellow sputum. Wet/congested cough noted.

## 2017-05-05 NOTE — Discharge Instructions (Signed)
You were given a prescription for antibiotics. Please take the antibiotic prescription fully. Please follow up with your primary doctor within the next 3-5 days to check resolution of your symptoms and to further evaluate your asthma. Please return to the ER sooner if you have any new or worsening symptoms.

## 2017-05-05 NOTE — ED Notes (Signed)
Pt ambulated around nrusing station. Pt maintained sats of 99% ra. Hr up to 130 with walking, quickly recovered to 115 after sitting. Nad. PA aware

## 2017-05-05 NOTE — ED Notes (Signed)
Pt taken to xray 

## 2017-07-19 ENCOUNTER — Encounter (HOSPITAL_COMMUNITY): Payer: Self-pay | Admitting: *Deleted

## 2017-07-19 ENCOUNTER — Other Ambulatory Visit: Payer: Self-pay

## 2017-07-19 ENCOUNTER — Emergency Department (HOSPITAL_COMMUNITY)
Admission: EM | Admit: 2017-07-19 | Discharge: 2017-07-19 | Disposition: A | Discharge status: Home / Self Care | Payer: Self-pay | Attending: Emergency Medicine | Admitting: Emergency Medicine

## 2017-07-19 DIAGNOSIS — Z79899 Other long term (current) drug therapy: Secondary | ICD-10-CM | POA: Insufficient documentation

## 2017-07-19 DIAGNOSIS — J4521 Mild intermittent asthma with (acute) exacerbation: Secondary | ICD-10-CM | POA: Insufficient documentation

## 2017-07-19 DIAGNOSIS — Z87891 Personal history of nicotine dependence: Secondary | ICD-10-CM | POA: Insufficient documentation

## 2017-07-19 DIAGNOSIS — J301 Allergic rhinitis due to pollen: Secondary | ICD-10-CM | POA: Insufficient documentation

## 2017-07-19 MED ORDER — MONTELUKAST SODIUM 10 MG PO TABS: 10.0000 mg | ORAL_TABLET | Freq: Every day | ORAL | 0 refills | Status: DC

## 2017-07-19 MED ORDER — ALBUTEROL SULFATE HFA 108 (90 BASE) MCG/ACT IN AERS
2.0000 | INHALATION_SPRAY | Freq: Once | RESPIRATORY_TRACT | Status: AC
  Administered 2017-07-19: 2 via RESPIRATORY_TRACT
  Filled 2017-07-19: qty 6.7

## 2017-07-19 MED ORDER — ALBUTEROL SULFATE HFA 108 (90 BASE) MCG/ACT IN AERS: 2.0000 | INHALATION_SPRAY | RESPIRATORY_TRACT | 3 refills | Status: DC | PRN

## 2017-07-19 MED ORDER — PREDNISONE 20 MG PO TABS
40.0000 mg | ORAL_TABLET | Freq: Once | ORAL | Status: AC
Start: 2017-07-19 — End: 2017-07-19
  Administered 2017-07-19: 40 mg via ORAL
  Filled 2017-07-19: qty 2

## 2017-07-19 NOTE — ED Provider Notes (Signed)
[facility] EMERGENCY DEPARTMENT Provider Note   CSN: [id number] Arrival date & time: 07/19/17  4098     History   Chief Complaint Chief Complaint  Patient presents with  . Shortness of Breath    HPI Elizabeth Stuart is a 22 y.o. female.  HPI  The patient is a 22 year old female, she has a known history of asthma as well as seasonal allergies, on a good day she takes her inhaler several times during allergy season, she does not use it that much outside of allergy season.  She has noted the increased use of this over the last several days but ran out of the medication yesterday.  She works graveyard shift, when she came home and went to sleep she was doing fine but when she woke up she was feeling short of breath, wheezing, tight in the chest as well as having a runny nose and itchy watery eyes.  She endorses taking daily over-the-counter anti-allergy medication which she gets from the pharmacy but she does not know the specific name of it.  She states the last time she had prednisone was several months ago.  Her symptoms are persistent, moderate, nothing seems to make it better or worse.  Past Medical History:  Diagnosis Date  . Allergy   . Asthma   . Contraceptive management 01/07/2015    Patient Active Problem List   Diagnosis Date Noted  . Mild intermittent asthma 02/11/2015  . Allergic rhinitis due to pollen 02/11/2015  . Oral allergy syndrome 02/11/2015  . Contraceptive management 01/07/2015  . Dyspnea 12/24/2012  . Hypoxemia 12/24/2012  . Caregiver smokes indoors and in family car 12/24/2012  . Mild intermittent asthma with acute exacerbation in pediatric patient 12/24/2012    History reviewed. No pertinent surgical history.   OB History    Gravida  0   Para  0   Term  0   Preterm  0   AB  0   Living  0     SAB  0   TAB  0   Ectopic  0   Multiple  0   Live Births               Home Medications    Prior to Admission medications     Medication Sig Start Date End Date Taking? Authorizing Provider  EPINEPHrine (EPIPEN IJ) Inject 0.3 mg as directed as needed (FOR SEVERE LIFE-THREATENING ALLERGIC REACTION). Reported on 05/01/2015   Yes [provider]  fluticasone (FLONASE) 50 MCG/ACT nasal spray Place 2 sprays into both nostrils daily. Patient taking differently: Place 2 sprays into both nostrils daily as needed for allergies or rhinitis.  05/05/17  Yes Couture, Cortni S, PA-C  albuterol (PROVENTIL HFA;VENTOLIN HFA) 108 (90 Base) MCG/ACT inhaler Inhale 2 puffs into the lungs every 4 (four) hours as needed for wheezing or shortness of breath. 07/19/17   [name], MD  montelukast (SINGULAIR) 10 MG tablet Take 1 tablet (10 mg total) by mouth at bedtime. 07/19/17 08/18/17  [name], MD  Norethindrone-Ethinyl Estradiol-Fe Biphas (LO LOESTRIN FE) 1 MG-10 MCG / 10 MCG tablet Take 1 tablet by mouth daily. Take 1 daily by mouth 01/07/15 05/01/15  Adline Potter, NP    Family History Family History  Problem Relation Age of Onset  . Diabetes Sister   . Diabetes Brother   . Asthma Brother   . Cancer Father        colon, lung  . Hypertension Maternal Grandmother   .  Diabetes Maternal Grandmother   . Arthritis Maternal Grandmother   . Hypertension Maternal Grandfather     Social History Social History   Tobacco Use  . Smoking status: Former Smoker    Types: Cigars    Last attempt to quit: 01/06/2015    Years since quitting: 2.5  . Smokeless tobacco: Never Used  Substance Use Topics  . Alcohol use: No    Alcohol/week: 0.0 oz  . Drug use: No     Allergies   Dust mite extract; Other; Peanuts [peanut oil]; and Tomato   Review of Systems Review of Systems  Constitutional: Negative for fever.  HENT: Positive for congestion and rhinorrhea.   Eyes: Positive for redness and itching.  Respiratory: Positive for cough, shortness of breath and wheezing.      Physical Exam Updated Vital Signs BP 122/76    Pulse 76   Temp 98.1 F (36.7 C) (Oral)   Resp 20   LMP 05/21/2017   SpO2 94%   Physical Exam  Constitutional: She appears well-developed and well-nourished.  HENT:  Head: Normocephalic and atraumatic.  Clear rhinorrhea present bilaterally, oropharynx clear and moist  Eyes: Conjunctivae are normal. Right eye exhibits no discharge. Left eye exhibits no discharge.  Pulmonary/Chest: Effort normal. No respiratory distress. She has wheezes.  Normal work of breathing, speaks in full sentences, diffuse expiratory wheezing found in all lung fields.  No prolonged expiratory phase, no rales, no rhonchi  Neurological: She is alert. Coordination normal.  Skin: Skin is warm and dry. No rash noted. She is not diaphoretic. No erythema.  Psychiatric: She has a normal mood and affect.  Nursing note and vitals reviewed.    ED Treatments / Results  Labs (all labs ordered are listed, but only abnormal results are displayed) Labs Reviewed - No data to display   Radiology No results found.  Procedures Procedures (including critical care time)  Medications Ordered in ED Medications  predniSONE (DELTASONE) tablet 40 mg (has no administration in time range)  albuterol (PROVENTIL HFA;VENTOLIN HFA) 108 (90 Base) MCG/ACT inhaler 2 puff (2 puffs Inhalation Given 07/19/17 1943)     Initial Impression / Assessment and Plan / ED Course  I have reviewed the triage vital signs and the nursing notes.  Pertinent labs & imaging results that were available during my care of the patient were reviewed by me and considered in my medical decision making (see chart for details).     The patient appears to have an acute asthma exacerbation likely secondary to seasonal allergies and allergic rhinitis.  She will be treated here with a dose of prednisone and an inhaler.  She will go to work tonight with this inhaler to use as needed.  She is aware of when and how to use it.  She will also be given prescriptions  for Singulair and encouraged to take Zyrtec.  She expressed her understanding, she appears stable for discharge  Vitals:   07/19/17 1922 07/19/17 1935 07/19/17 1937  BP: (!) 127/113 122/76   Pulse: 86  76  Resp: 20  20  Temp: 98.1 F (36.7 C)    TempSrc: Oral    SpO2: 94%  94%     Final Clinical Impressions(s) / ED Diagnoses   Final diagnoses:  Asthma in adult, mild intermittent, with acute exacerbation  Seasonal allergic rhinitis due to pollen    ED Discharge Orders        Ordered    montelukast (SINGULAIR) 10 MG tablet  Daily  at bedtime     07/19/17 1943    albuterol (PROVENTIL HFA;VENTOLIN HFA) 108 (90 Base) MCG/ACT inhaler  Every 4 hours PRN     07/19/17 1944       Eber HongMiller, Natalin Bible, MD 07/19/17 1944

## 2017-07-19 NOTE — Discharge Instructions (Signed)
Please take albuterol inhaler every 4 hours as needed,  Singulair 10 mg daily for the next 30 days Zyrtec (cetirizine), nightly before bed Emergency department for severe or worsening symptoms

## 2017-07-19 NOTE — ED Triage Notes (Signed)
Pt states that she woke up 15 minutes ago feeling sob, has hx of asthma but states that she ran out of her inhaler yesterday, does admit to non productive cough as well,

## 2017-08-12 ENCOUNTER — Emergency Department (HOSPITAL_COMMUNITY)
Admission: EM | Admit: 2017-08-12 | Discharge: 2017-08-12 | Disposition: A | Discharge status: Home / Self Care | Payer: Self-pay | Attending: Emergency Medicine | Admitting: Emergency Medicine

## 2017-08-12 ENCOUNTER — Other Ambulatory Visit: Payer: Self-pay

## 2017-08-12 ENCOUNTER — Encounter (HOSPITAL_COMMUNITY): Payer: Self-pay | Admitting: *Deleted

## 2017-08-12 DIAGNOSIS — J45909 Unspecified asthma, uncomplicated: Secondary | ICD-10-CM | POA: Insufficient documentation

## 2017-08-12 DIAGNOSIS — Z79899 Other long term (current) drug therapy: Secondary | ICD-10-CM | POA: Insufficient documentation

## 2017-08-12 DIAGNOSIS — Z91010 Allergy to peanuts: Secondary | ICD-10-CM | POA: Insufficient documentation

## 2017-08-12 DIAGNOSIS — J301 Allergic rhinitis due to pollen: Secondary | ICD-10-CM | POA: Insufficient documentation

## 2017-08-12 MED ORDER — FEXOFENADINE-PSEUDOEPHED ER 60-120 MG PO TB12: 1.0000 | ORAL_TABLET | Freq: Two times a day (BID) | ORAL | 0 refills | Status: DC

## 2017-08-12 NOTE — Discharge Instructions (Addendum)
Use the medicine prescribed which should help relieve your allergy symptoms.  Avoid being outdoors on the most high pollen days like yesterday was (you can get this information from local weather information).

## 2017-08-12 NOTE — ED Provider Notes (Signed)
[facility] EMERGENCY DEPARTMENT Provider Note   CSN: [id number] Arrival date & time: 08/12/17  4098     History   Chief Complaint Chief Complaint  Patient presents with  . Nasal Congestion    HPI Elizabeth Stuart is a 22 y.o. female presenting with complaints of itchy, watery eyes with lid puffiness, clear nasal congestion with nasal congestion, sinus pressure which started yesterday after spending the day outdoors.  She denies fevers, chills, sob, wheezing, ear pain, chest pain or other complaint.  She states her asthma is under good control right now.  She has tried plain zyrtec x 1 yesterday which did not relieve her symptoms.  The history is provided by the patient.    Past Medical History:  Diagnosis Date  . Allergy   . Asthma   . Contraceptive management 01/07/2015    Patient Active Problem List   Diagnosis Date Noted  . Mild intermittent asthma 02/11/2015  . Allergic rhinitis due to pollen 02/11/2015  . Oral allergy syndrome 02/11/2015  . Contraceptive management 01/07/2015  . Dyspnea 12/24/2012  . Hypoxemia 12/24/2012  . Caregiver smokes indoors and in family car 12/24/2012  . Mild intermittent asthma with acute exacerbation in pediatric patient 12/24/2012    History reviewed. No pertinent surgical history.   OB History    Gravida  0   Para  0   Term  0   Preterm  0   AB  0   Living  0     SAB  0   TAB  0   Ectopic  0   Multiple  0   Live Births               Home Medications    Prior to Admission medications   Medication Sig Start Date End Date Taking? Authorizing Provider  albuterol (PROVENTIL HFA;VENTOLIN HFA) 108 (90 Base) MCG/ACT inhaler Inhale 2 puffs into the lungs every 4 (four) hours as needed for wheezing or shortness of breath. 07/19/17   [name], MD  EPINEPHrine (EPIPEN IJ) Inject 0.3 mg as directed as needed (FOR SEVERE LIFE-THREATENING ALLERGIC REACTION). Reported on 05/01/2015    [provider]    fexofenadine-pseudoephedrine (ALLEGRA-D) 60-120 MG 12 hr tablet Take 1 tablet by mouth every 12 (twelve) hours. 08/12/17   Burgess Amor, PA-C  fluticasone (FLONASE) 50 MCG/ACT nasal spray Place 2 sprays into both nostrils daily. Patient taking differently: Place 2 sprays into both nostrils daily as needed for allergies or rhinitis.  05/05/17   Couture, Cortni S, PA-C  montelukast (SINGULAIR) 10 MG tablet Take 1 tablet (10 mg total) by mouth at bedtime. 07/19/17 08/18/17  [name], MD  Norethindrone-Ethinyl Estradiol-Fe Biphas (LO LOESTRIN FE) 1 MG-10 MCG / 10 MCG tablet Take 1 tablet by mouth daily. Take 1 daily by mouth 01/07/15 05/01/15  Adline Potter, NP    Family History Family History  Problem Relation Age of Onset  . Diabetes Sister   . Diabetes Brother   . Asthma Brother   . Cancer Father        colon, lung  . Hypertension Maternal Grandmother   . Diabetes Maternal Grandmother   . Arthritis Maternal Grandmother   . Hypertension Maternal Grandfather     Social History Social History   Tobacco Use  . Smoking status: Former Smoker    Types: Cigars    Last attempt to quit: 01/06/2015    Years since quitting: 2.6  . Smokeless tobacco: Never Used  Substance Use Topics  . Alcohol use: No    Alcohol/week: 0.0 oz  . Drug use: No     Allergies   Dust mite extract; Other; Peanuts [peanut oil]; and Tomato   Review of Systems Review of Systems  Constitutional: Negative for chills and fever.  HENT: Positive for congestion, postnasal drip and rhinorrhea. Negative for ear pain, sinus pressure, sore throat, trouble swallowing and voice change.   Eyes: Positive for itching. Negative for discharge.  Respiratory: Negative for cough, shortness of breath, wheezing and stridor.   Cardiovascular: Negative for chest pain.  Gastrointestinal: Negative for abdominal pain.  Genitourinary: Negative.      Physical Exam Updated Vital Signs BP 108/67 (BP Location: Left Arm)   Pulse  99   Temp 98.5 F (36.9 C) (Oral)   Resp 16   Ht  (1.6 m)   Wt 97.5 kg (215 lb)   LMP  (Within Months)   SpO2 95%   BMI 38.09 kg/m   Physical Exam  Constitutional: She is oriented to person, place, and time. She appears well-developed and well-nourished.  HENT:  Head: Normocephalic and atraumatic.  Right Ear: Tympanic membrane and ear canal normal.  Left Ear: Tympanic membrane and ear canal normal.  Nose: Mucosal edema and rhinorrhea present.  Mouth/Throat: Uvula is midline, oropharynx is clear and moist and mucous membranes are normal. No oropharyngeal exudate, posterior oropharyngeal edema, posterior oropharyngeal erythema or tonsillar abscesses.  Boggy pale nasal mucosa.  Eyes: Conjunctivae are normal.  Cardiovascular: Normal rate and normal heart sounds.  Pulmonary/Chest: Effort normal. No respiratory distress. She has no wheezes. She has no rales.  Abdominal: Soft. There is no tenderness.  Musculoskeletal: Normal range of motion.  Neurological: She is alert and oriented to person, place, and time.  Skin: Skin is warm and dry. No rash noted.  Psychiatric: She has a normal mood and affect.     ED Treatments / Results  Labs (all labs ordered are listed, but only abnormal results are displayed) Labs Reviewed - No data to display  EKG None  Radiology No results found.  Procedures Procedures (including critical care time)  Medications Ordered in ED Medications - No data to display   Initial Impression / Assessment and Plan / ED Course  I have reviewed the triage vital signs and the nursing notes.  Pertinent labs & imaging results that were available during my care of the patient were reviewed by me and considered in my medical decision making (see chart for details).     Pt with exam and hx suggesting seasonal allergy.  Zyrtec d, flonase (pt already has). Advised to avoid high pollen days outdoors. Prn f/u. Lungs clear, no wheeze or respiratory distress. No  findings to suggest acute sinusitis.  Final Clinical Impressions(s) / ED Diagnoses   Final diagnoses:  Seasonal allergic rhinitis due to pollen    ED Discharge Orders        Ordered    fexofenadine-pseudoephedrine (ALLEGRA-D) 60-120 MG 12 hr tablet  Every 12 hours     08/12/17 0821       Burgess Amor, PA-C 08/12/17 0981    Bethann Berkshire, MD 08/12/17 1326

## 2017-08-12 NOTE — ED Triage Notes (Signed)
Pt c/o runny nose, sneezing, bilateral eye swelling, headache and fatigue that started yesterday. Unknown fever at home.

## 2017-09-02 ENCOUNTER — Emergency Department (HOSPITAL_COMMUNITY)
Admission: EM | Admit: 2017-09-02 | Discharge: 2017-09-02 | Disposition: A | Discharge status: Home / Self Care | Payer: Self-pay | Attending: Emergency Medicine | Admitting: Emergency Medicine

## 2017-09-02 ENCOUNTER — Encounter (HOSPITAL_COMMUNITY): Payer: Self-pay

## 2017-09-02 DIAGNOSIS — Z79899 Other long term (current) drug therapy: Secondary | ICD-10-CM | POA: Insufficient documentation

## 2017-09-02 DIAGNOSIS — M549 Dorsalgia, unspecified: Secondary | ICD-10-CM | POA: Insufficient documentation

## 2017-09-02 DIAGNOSIS — Z91010 Allergy to peanuts: Secondary | ICD-10-CM | POA: Insufficient documentation

## 2017-09-02 DIAGNOSIS — Z87891 Personal history of nicotine dependence: Secondary | ICD-10-CM | POA: Insufficient documentation

## 2017-09-02 DIAGNOSIS — J4541 Moderate persistent asthma with (acute) exacerbation: Secondary | ICD-10-CM | POA: Insufficient documentation

## 2017-09-02 MED ORDER — ACETAMINOPHEN 500 MG PO TABS
1000.0000 mg | ORAL_TABLET | Freq: Once | ORAL | Status: AC
Start: 2017-09-02 — End: 2017-09-02
  Administered 2017-09-02: 1000 mg via ORAL
  Filled 2017-09-02: qty 2

## 2017-09-02 MED ORDER — ALBUTEROL SULFATE (2.5 MG/3ML) 0.083% IN NEBU
5.0000 mg | INHALATION_SOLUTION | Freq: Once | RESPIRATORY_TRACT | Status: AC
  Administered 2017-09-02: 5 mg via RESPIRATORY_TRACT
  Filled 2017-09-02: qty 6

## 2017-09-02 MED ORDER — PREDNISONE 20 MG PO TABS: 60.0000 mg | ORAL_TABLET | Freq: Every day | ORAL | 0 refills | Status: DC

## 2017-09-02 MED ORDER — PREDNISONE 50 MG PO TABS
60.0000 mg | ORAL_TABLET | Freq: Once | ORAL | Status: AC
  Administered 2017-09-02: 22:00:00 60 mg via ORAL
  Filled 2017-09-02: qty 1

## 2017-09-02 MED ORDER — AEROCHAMBER PLUS W/MASK MISC
1.0000 | Freq: Once | Status: AC
  Administered 2017-09-02: 1
  Filled 2017-09-02: qty 1

## 2017-09-02 MED ORDER — ALBUTEROL SULFATE HFA 108 (90 BASE) MCG/ACT IN AERS
2.0000 | INHALATION_SPRAY | RESPIRATORY_TRACT | Status: DC | PRN
  Administered 2017-09-02: 2 via RESPIRATORY_TRACT
  Filled 2017-09-02: qty 6.7

## 2017-09-02 NOTE — ED Triage Notes (Signed)
Pt reports asthma flare up that started this morning. Pt reports productive cough. Has taken albuterol MDI and nebulizer with no improvement.

## 2017-09-02 NOTE — ED Provider Notes (Signed)
[facility] EMERGENCY DEPARTMENT Provider Note   CSN: [id number] Arrival date & time: 09/02/17  2018     History   Chief Complaint Chief Complaint  Patient presents with  . Shortness of Breath    asthma    HPI Elizabeth Stuart is a 22 y.o. female.complains of shortness of breath typical of her asthma onset this morning. She treated self with Claritin-D and albuterol inhaler without relief. Associated symptoms include mild cough, nonproductive. No fever. She lso complains of back pain bilateral thoracic area worse with deep inspiration since onset oasthma.  HPI  Past Medical History:  Diagnosis Date  . Allergy   . Asthma   . Contraceptive management 01/07/2015    Patient Active Problem List   Diagnosis Date Noted  . Mild intermittent asthma 02/11/2015  . Allergic rhinitis due to pollen 02/11/2015  . Oral allergy syndrome 02/11/2015  . Contraceptive management 01/07/2015  . Dyspnea 12/24/2012  . Hypoxemia 12/24/2012  . Caregiver smokes indoors and in family car 12/24/2012  . Mild intermittent asthma with acute exacerbation in pediatric patient 12/24/2012    History reviewed. No pertinent surgical history.   OB History    Gravida  0   Para  0   Term  0   Preterm  0   AB  0   Living  0     SAB  0   TAB  0   Ectopic  0   Multiple  0   Live Births               Home Medications    Prior to Admission medications   Medication Sig Start Date End Date Taking? Authorizing Provider  albuterol (PROVENTIL HFA;VENTOLIN HFA) 108 (90 Base) MCG/ACT inhaler Inhale 2 puffs into the lungs every 4 (four) hours as needed for wheezing or shortness of breath. 07/19/17   [name], MD  EPINEPHrine (EPIPEN IJ) Inject 0.3 mg as directed as needed (FOR SEVERE LIFE-THREATENING ALLERGIC REACTION). Reported on 05/01/2015    [provider]  fexofenadine-pseudoephedrine (ALLEGRA-D) 60-120 MG 12 hr tablet Take 1 tablet by mouth every 12 (twelve) hours. 08/12/17    Burgess Amor, PA-C  fluticasone (FLONASE) 50 MCG/ACT nasal spray Place 2 sprays into both nostrils daily. Patient taking differently: Place 2 sprays into both nostrils daily as needed for allergies or rhinitis.  05/05/17   Couture, Cortni S, PA-C  montelukast (SINGULAIR) 10 MG tablet Take 1 tablet (10 mg total) by mouth at bedtime. 07/19/17 08/18/17  [name], MD  Norethindrone-Ethinyl Estradiol-Fe Biphas (LO LOESTRIN FE) 1 MG-10 MCG / 10 MCG tablet Take 1 tablet by mouth daily. Take 1 daily by mouth 01/07/15 05/01/15  Adline Potter, NP    Family History Family History  Problem Relation Age of Onset  . Diabetes Sister   . Diabetes Brother   . Asthma Brother   . Cancer Father        colon, lung  . Hypertension Maternal Grandmother   . Diabetes Maternal Grandmother   . Arthritis Maternal Grandmother   . Hypertension Maternal Grandfather     Social History Social History   Tobacco Use  . Smoking status: Former Smoker    Types: Cigars    Last attempt to quit: 01/06/2015    Years since quitting: 2.6  . Smokeless tobacco: Never Used  Substance Use Topics  . Alcohol use: No    Alcohol/week: 0.0 oz  . Drug use: No     Allergies  Dust mite extract; Other; Peanuts [peanut oil]; and Tomato   Review of Systems Review of Systems  Constitutional: Negative.   HENT: Negative.   Respiratory: Positive for cough, shortness of breath and wheezing.   Cardiovascular: Negative.   Gastrointestinal: Negative.   Musculoskeletal: Positive for back pain.  Skin: Negative.   Neurological: Negative.   Psychiatric/Behavioral: Negative.   All other systems reviewed and are negative.    Physical Exam Updated Vital Signs BP 130/81 (BP Location: Right Arm)   Pulse (!) 129   Temp 99.1 F (37.3 C) (Oral)   Resp (!) 22   Ht  (1.6 m)   Wt 97.5 kg (215 lb)   LMP 09/02/2017   SpO2 93%   BMI 38.09 kg/m   Physical Exam  Constitutional: She is oriented to person, place, and time.  She appears well-developed and well-nourished. She appears distressed.  Mild respiratory distress  HENT:  Head: Normocephalic and atraumatic.  Eyes: Pupils are equal, round, and reactive to light. Conjunctivae are normal.  Neck: Neck supple. No tracheal deviation present. No thyromegaly present.  Cardiovascular: Regular rhythm.  No murmur heard. tachycardic  Pulmonary/Chest: She is in respiratory distress. She has wheezes.  Long expiratory phase with expiratory wheezes, mild use of accessory muscles  Abdominal: Soft. Bowel sounds are normal. She exhibits no distension. There is no tenderness.  Musculoskeletal: Normal range of motion. She exhibits no edema or tenderness.  Back is nontender  Neurological: She is alert and oriented to person, place, and time. Coordination normal.  Skin: Skin is warm and dry. No rash noted.  Psychiatric: She has a normal mood and affect.  Nursing note and vitals reviewed.    ED Treatments / Results  Labs (all labs ordered are listed, but only abnormal results are displayed) Labs Reviewed - No data to display  EKG None  Radiology No results found.  Procedures Procedures (including critical care time)  Medications Ordered in ED Medications  predniSONE (DELTASONE) tablet 60 mg (has no administration in time range)  albuterol (PROVENTIL) (2.5 MG/3ML) 0.083% nebulizer solution 5 mg (has no administration in time range)     Initial Impression / Assessment and Plan / ED Course  I have reviewed the triage vital signs and the nursing notes.  Pertinent labs & imaging results that were available during my care of the patient were reviewed by me and considered in my medical decision making (see chart for details).     10:05 PMbreathing is improved after treatment with prednisone and albuterol nebulized treatment. Not at baseline.she also continues to complain of back pain. She is no respiratory distress. Speaks in paragraphs. Lungs with end  expiratory wheezes.she has symmetric bilateral breath sounds.second albuterol nebulized treatment ordered as well as Tylenolfor pain. Back pain is felt to be muscular 1010 pm pt signed out to Dr Preston Fleeting Albuterol HFA with spacer or to go anticipating discharge, as she states her albuterol HFA has run out Final Clinical Impressions(s) / ED Diagnoses  Diagnosis #1 exacerbation of asthma #2 back pain Final diagnoses:  None    ED Discharge Orders    None       Doug Sou, MD 09/02/17 2214

## 2017-09-02 NOTE — Discharge Instructions (Addendum)
Use your inhaler - two puffs, every four hours - as needed for wheezing, shortness of breath, or coughing. Return if you are getting worse.

## 2017-09-02 NOTE — ED Provider Notes (Signed)
Care assumed from Dr. Ethelda Chick, 22 year old patient with history of asthma presented with wheezing and dyspnea.  She had received 1 nebulizer treatment prior to care being turned over to me.  She received a second nebulizer treatment and states that she feels like she is breathing much better.  On exam, lungs are clear.  She is noted to be tachycardic, and this is felt to be secondary to the albuterol.  She is nontoxic in appearance and is not tachypneic, and she is not using accessory muscles of respiration.  She is felt to be safe for discharge.  She is discharged with prescription for prednisone.  She had been given albuterol inhaler in the ED.  Return precautions discussed.   Dione Booze, MD 09/02/17 [phone]

## 2017-09-26 ENCOUNTER — Encounter (HOSPITAL_COMMUNITY): Payer: Self-pay | Admitting: Emergency Medicine

## 2017-09-26 ENCOUNTER — Emergency Department (HOSPITAL_COMMUNITY)
Admission: EM | Admit: 2017-09-26 | Discharge: 2017-09-26 | Disposition: A | Discharge status: Home / Self Care | Payer: Self-pay | Attending: Emergency Medicine | Admitting: Emergency Medicine

## 2017-09-26 ENCOUNTER — Other Ambulatory Visit: Payer: Self-pay

## 2017-09-26 DIAGNOSIS — J45909 Unspecified asthma, uncomplicated: Secondary | ICD-10-CM | POA: Insufficient documentation

## 2017-09-26 DIAGNOSIS — R1011 Right upper quadrant pain: Secondary | ICD-10-CM | POA: Insufficient documentation

## 2017-09-26 DIAGNOSIS — Z87891 Personal history of nicotine dependence: Secondary | ICD-10-CM | POA: Insufficient documentation

## 2017-09-26 DIAGNOSIS — Z79899 Other long term (current) drug therapy: Secondary | ICD-10-CM | POA: Insufficient documentation

## 2017-09-26 DIAGNOSIS — Z91010 Allergy to peanuts: Secondary | ICD-10-CM | POA: Insufficient documentation

## 2017-09-26 LAB — CBC
HEMATOCRIT: 34.7 % — AB (ref 36.0–46.0)
HEMOGLOBIN: 11.5 g/dL — AB (ref 12.0–15.0)
MCH: 27.9 pg (ref 26.0–34.0)
MCHC: 33.1 g/dL (ref 30.0–36.0)
MCV: 84.2 fL (ref 78.0–100.0)
Platelets: 360 10*3/uL (ref 150–400)
RBC: 4.12 MIL/uL (ref 3.87–5.11)
RDW: 13.2 % (ref 11.5–15.5)
WBC: 7.5 10*3/uL (ref 4.0–10.5)

## 2017-09-26 LAB — URINALYSIS, ROUTINE W REFLEX MICROSCOPIC
BILIRUBIN URINE: NEGATIVE
Bacteria, UA: NONE SEEN
Glucose, UA: NEGATIVE mg/dL
Ketones, ur: NEGATIVE mg/dL
LEUKOCYTES UA: NEGATIVE
Nitrite: NEGATIVE
Protein, ur: NEGATIVE mg/dL
SPECIFIC GRAVITY, URINE: 1.014 (ref 1.005–1.030)
pH: 6 (ref 5.0–8.0)

## 2017-09-26 LAB — COMPREHENSIVE METABOLIC PANEL
ALK PHOS: 54 U/L (ref 38–126)
ALT: 16 U/L (ref 14–54)
AST: 11 U/L — AB (ref 15–41)
Albumin: 3.6 g/dL (ref 3.5–5.0)
Anion gap: 8 (ref 5–15)
BILIRUBIN TOTAL: 0.3 mg/dL (ref 0.3–1.2)
BUN: 11 mg/dL (ref 6–20)
CALCIUM: 8.8 mg/dL — AB (ref 8.9–10.3)
CO2: 23 mmol/L (ref 22–32)
Chloride: 104 mmol/L (ref 101–111)
Creatinine, Ser: 0.75 mg/dL (ref 0.44–1.00)
GFR calc Af Amer: >60 mL/min (ref >60)
GFR calc non Af Amer: >60 mL/min (ref >60)
GLUCOSE: 87 mg/dL (ref 65–99)
Potassium: 3.6 mmol/L (ref 3.5–5.1)
SODIUM: 135 mmol/L (ref 135–145)
TOTAL PROTEIN: 6.7 g/dL (ref 6.5–8.1)

## 2017-09-26 LAB — PREGNANCY, URINE: PREG TEST UR: NEGATIVE

## 2017-09-26 MED ORDER — OXYCODONE-ACETAMINOPHEN 5-325 MG PO TABS
2.0000 | ORAL_TABLET | Freq: Once | ORAL | Status: AC
  Administered 2017-09-26: 2 via ORAL
  Filled 2017-09-26: qty 2

## 2017-09-26 NOTE — ED Provider Notes (Addendum)
[facility] EMERGENCY DEPARTMENT Provider Note   CSN: [id number] Arrival date & time: 09/26/17  [id number]     History   Chief Complaint Chief Complaint  Patient presents with  . Abdominal Pain    HPI Elizabeth Stuart is a 22 y.o. female.  HPI  22 yo female co ruq pain began at 0500 while working after drinking coffee.  Denies nausea, vomiting, fever, chills, cough or dyspnea.  Denies similar symptoms in past.  Pain 9/10.  Worsens with movement.  Denies injury.  Did not take any meds or do anything to make it better.  No similar symptoms in past.  lmp 5/30-normal g0 Sexually active- no birth control,  Past Medical History:  Diagnosis Date  . Allergy   . Asthma   . Contraceptive management 01/07/2015    Patient Active Problem List   Diagnosis Date Noted  . Mild intermittent asthma 02/11/2015  . Allergic rhinitis due to pollen 02/11/2015  . Oral allergy syndrome 02/11/2015  . Contraceptive management 01/07/2015  . Dyspnea 12/24/2012  . Hypoxemia 12/24/2012  . Caregiver smokes indoors and in family car 12/24/2012  . Mild intermittent asthma with acute exacerbation in pediatric patient 12/24/2012    History reviewed. No pertinent surgical history.   OB History    Gravida  0   Para  0   Term  0   Preterm  0   AB  0   Living  0     SAB  0   TAB  0   Ectopic  0   Multiple  0   Live Births               Home Medications    Prior to Admission medications   Medication Sig Start Date End Date Taking? Authorizing Provider  albuterol (PROVENTIL HFA;VENTOLIN HFA) 108 (90 Base) MCG/ACT inhaler Inhale 2 puffs into the lungs every 4 (four) hours as needed for wheezing or shortness of breath. 07/19/17   Eber HongMiller, Brian, MD  EPINEPHrine (EPIPEN IJ) Inject 0.3 mg as directed as needed (FOR SEVERE LIFE-THREATENING ALLERGIC REACTION). Reported on 05/01/2015    [provider]  fexofenadine-pseudoephedrine (ALLEGRA-D) 60-120 MG 12 hr tablet Take 1 tablet by mouth  every 12 (twelve) hours. 08/12/17   Burgess AmorIdol, Julie, PA-C  fluticasone (FLONASE) 50 MCG/ACT nasal spray Place 2 sprays into both nostrils daily. Patient taking differently: Place 2 sprays into both nostrils daily as needed for allergies or rhinitis.  05/05/17   Couture, Cortni S, PA-C  montelukast (SINGULAIR) 10 MG tablet Take 1 tablet (10 mg total) by mouth at bedtime. 07/19/17 08/18/17  Eber HongMiller, Brian, MD  predniSONE (DELTASONE) 20 MG tablet Take 3 tablets (60 mg total) by mouth daily. 09/02/17   Dione BoozeGlick, David, MD  Norethindrone-Ethinyl Estradiol-Fe Biphas (LO LOESTRIN FE) 1 MG-10 MCG / 10 MCG tablet Take 1 tablet by mouth daily. Take 1 daily by mouth 01/07/15 05/01/15  Adline PotterGriffin, Jennifer A, NP    Family History Family History  Problem Relation Age of Onset  . Diabetes Sister   . Diabetes Brother   . Asthma Brother   . Cancer Father        colon, lung  . Hypertension Maternal Grandmother   . Diabetes Maternal Grandmother   . Arthritis Maternal Grandmother   . Hypertension Maternal Grandfather     Social History Social History   Tobacco Use  . Smoking status: Former Smoker    Types: Cigars    Last attempt to quit: 01/06/2015  Years since quitting: 2.7  . Smokeless tobacco: Never Used  Substance Use Topics  . Alcohol use: No    Alcohol/week: 0.0 oz  . Drug use: No     Allergies   Dust mite extract; Other; Peanuts [peanut oil]; and Tomato   Review of Systems Review of Systems  All other systems reviewed and are negative.    Physical Exam Updated Vital Signs BP 120/86   Pulse 88   Temp 98.1 F (36.7 C) (Oral)   Resp 16   Ht 1.6 m (5\' 3" )   Wt 99.8 kg (220 lb)   LMP 09/02/2017 (Exact Date)   SpO2 99%   BMI 38.97 kg/m   Physical Exam  Constitutional: She is oriented to person, place, and time. She appears well-developed and well-nourished. She does not appear ill.  HENT:  Head: Normocephalic and atraumatic.  Mouth/Throat: Oropharynx is clear and moist.  Eyes: Pupils  are equal, round, and reactive to light. EOM are normal.  Cardiovascular: Normal rate, regular rhythm, normal heart sounds and intact distal pulses.  Pulmonary/Chest: Effort normal.  Abdominal: Soft. Normal appearance and bowel sounds are normal. There is tenderness in the right upper quadrant.  Mild ttp ruq, no rebound  Neurological: She is alert and oriented to person, place, and time.  Skin: Skin is warm and dry. Capillary refill takes less than 2 seconds.  Psychiatric: She has a normal mood and affect.  Nursing note and vitals reviewed.    ED Treatments / Results  Labs (all labs ordered are listed, but only abnormal results are displayed) Labs Reviewed - No data to display  EKG None  Radiology No results found.  Procedures Procedures (including critical care time)  Medications Ordered in ED Medications - No data to display   Initial Impression / Assessment and Plan / ED Course  I have reviewed the triage vital signs and the nursing notes.  Pertinent labs & imaging results that were available during my care of the patient were reviewed by me and considered in my medical decision making (see chart for details).     Patient with some ruq pain described as worse with movement.  Mild ttp with palpation.  Labs not c.w. Acute cholecystitis, symptoms not c.w. Renal colic, pe, or other lung conditions. Plan f/u pmd to be scheduled for [place] Discussed need for f/u and return precautions and patient voices understanding  Final Clinical Impressions(s) / ED Diagnoses   Final diagnoses:  RUQ pain    ED Discharge Orders    None       Margarita Grizzle, MD 09/27/17 1201    Margarita Grizzle, MD 09/27/17 1202

## 2017-09-26 NOTE — Discharge Instructions (Signed)
Please take clear liquids today Call your primary care doctor tomorrow for recheck.

## 2017-09-26 NOTE — ED Triage Notes (Signed)
Right upper abdominal pain started at 5 am.  Rates intermittent pain 9/10.

## 2017-09-27 ENCOUNTER — Encounter (HOSPITAL_COMMUNITY): Payer: Self-pay | Admitting: Emergency Medicine

## 2017-09-27 ENCOUNTER — Emergency Department (HOSPITAL_COMMUNITY)
Admission: EM | Admit: 2017-09-27 | Discharge: 2017-09-27 | Disposition: A | Discharge status: Home / Self Care | Payer: Self-pay | Attending: Physician Assistant | Admitting: Physician Assistant

## 2017-09-27 DIAGNOSIS — Z87891 Personal history of nicotine dependence: Secondary | ICD-10-CM | POA: Insufficient documentation

## 2017-09-27 DIAGNOSIS — Z79899 Other long term (current) drug therapy: Secondary | ICD-10-CM | POA: Insufficient documentation

## 2017-09-27 DIAGNOSIS — Z91010 Allergy to peanuts: Secondary | ICD-10-CM | POA: Insufficient documentation

## 2017-09-27 DIAGNOSIS — R310 Gross hematuria: Secondary | ICD-10-CM | POA: Insufficient documentation

## 2017-09-27 DIAGNOSIS — J4520 Mild intermittent asthma, uncomplicated: Secondary | ICD-10-CM | POA: Insufficient documentation

## 2017-09-27 LAB — URINALYSIS, ROUTINE W REFLEX MICROSCOPIC
BILIRUBIN URINE: NEGATIVE
Glucose, UA: NEGATIVE mg/dL
Ketones, ur: 20 mg/dL — AB
Leukocytes, UA: NEGATIVE
NITRITE: NEGATIVE
PH: 6 (ref 5.0–8.0)
Protein, ur: NEGATIVE mg/dL
SPECIFIC GRAVITY, URINE: 1.006 (ref 1.005–1.030)

## 2017-09-27 LAB — POC URINE PREG, ED: Preg Test, Ur: NEGATIVE

## 2017-09-27 MED ORDER — NAPROXEN 375 MG PO TABS: 375.0000 mg | ORAL_TABLET | Freq: Two times a day (BID) | ORAL | 0 refills | Status: DC

## 2017-09-27 MED ORDER — FOSFOMYCIN TROMETHAMINE 3 G PO PACK
3.0000 g | PACK | Freq: Once | ORAL | Status: AC
  Administered 2017-09-27: 3 g via ORAL
  Filled 2017-09-27: qty 3

## 2017-09-27 NOTE — Discharge Instructions (Addendum)
Contact a health care provider if: °You have back pain. °You have a fever. °You feel nauseous or vomit. °Your symptoms do not get better after 3 days. °Your symptoms go away and then return. °Get help right away if: °You have severe back pain or lower abdominal pain. °You are vomiting and cannot keep down any medicines or water. °

## 2017-09-27 NOTE — ED Notes (Signed)
Pt states there is blood when she wipes but unsure if period

## 2017-09-27 NOTE — ED Triage Notes (Signed)
Pt seen at [facility] for upper R quadrant belly pain yesterday. Was told US needed but did not have in hosp. Was told to f/u with PCP. Says that pain is better now has diffuse back pain. No signs of distress. Ambulates without distress.

## 2017-09-27 NOTE — ED Provider Notes (Signed)
[facility]   CSN: [id number] Arrival date & time: 09/27/17  1018     History   Chief Complaint Chief Complaint  Patient presents with  . Back Pain    HPI Christell ConstantKiara A [facility] is a 22 y.o. female who presents the emergency department chief coming of back pain.  Patient was seen at [facility] yesterday with right upper quadrant abdominal pain.  She states that she had a work-up done and was told that she might have gallstones and need to follow-up with her primary care physician.  She goes to the health department and they told her that they did not have the equipment to do examine her there and so she came to the emergency department.  She denies abdominal pain today however is complaining of pain in her mid back.  She noticed some blood when wiping.  Patient initially was unable to distinguish if it was from her urine or vagina however I had her return to the bathroom and she was able to tell that the blood is coming from her urinary stream.  She denies fevers or chills.  She has no other urinary symptoms.  She complains of pain in the mid back that is worse with twisting and movement.    Seen yesterday for abdominal pain yesterday  HPI  Past Medical History:  Diagnosis Date  . Allergy   . Asthma   . Contraceptive management 01/07/2015    Patient Active Problem List   Diagnosis Date Noted  . Mild intermittent asthma 02/11/2015  . Allergic rhinitis due to pollen 02/11/2015  . Oral allergy syndrome 02/11/2015  . Contraceptive management 01/07/2015  . Dyspnea 12/24/2012  . Hypoxemia 12/24/2012  . Caregiver smokes indoors and in family car 12/24/2012  . Mild intermittent asthma with acute exacerbation in pediatric patient 12/24/2012    History reviewed. No pertinent surgical history.   OB History    Gravida  0   Para  0   Term  0   Preterm  0   AB  0   Living  0     SAB  0   TAB  0   Ectopic  0   Multiple  0   Live Births               Home Medications    Prior to Admission medications   Medication Sig Start Date End Date Taking? Authorizing Provider  albuterol (PROVENTIL HFA;VENTOLIN HFA) 108 (90 Base) MCG/ACT inhaler Inhale 2 puffs into the lungs every 4 (four) hours as needed for wheezing or shortness of breath. 07/19/17   Eber HongMiller, Brian, MD  EPINEPHrine (EPIPEN IJ) Inject 0.3 mg as directed as needed (FOR SEVERE LIFE-THREATENING ALLERGIC REACTION). Reported on 05/01/2015    [provider]  fexofenadine-pseudoephedrine (ALLEGRA-D) 60-120 MG 12 hr tablet Take 1 tablet by mouth every 12 (twelve) hours. 08/12/17   Burgess AmorIdol, Julie, PA-C  fluticasone (FLONASE) 50 MCG/ACT nasal spray Place 2 sprays into both nostrils daily. Patient taking differently: Place 2 sprays into both nostrils daily as needed for allergies or rhinitis.  05/05/17   Couture, Cortni S, PA-C  montelukast (SINGULAIR) 10 MG tablet Take 1 tablet (10 mg total) by mouth at bedtime. 07/19/17 08/18/17  Eber HongMiller, Brian, MD  predniSONE (DELTASONE) 20 MG tablet Take 3 tablets (60 mg total) by mouth daily. 09/02/17   Dione BoozeGlick, David, MD  Norethindrone-Ethinyl Estradiol-Fe Biphas (LO LOESTRIN FE) 1 MG-10 MCG / 10 MCG tablet  Take 1 tablet by mouth daily. Take 1 daily by mouth 01/07/15 05/01/15  Adline Potter, NP    Family History Family History  Problem Relation Age of Onset  . Diabetes Sister   . Diabetes Brother   . Asthma Brother   . Cancer Father        colon, lung  . Hypertension Maternal Grandmother   . Diabetes Maternal Grandmother   . Arthritis Maternal Grandmother   . Hypertension Maternal Grandfather     Social History Social History   Tobacco Use  . Smoking status: Former Smoker    Types: Cigars    Last attempt to quit: 01/06/2015    Years since quitting: 2.7  . Smokeless tobacco: Never Used  Substance Use Topics  . Alcohol use: No    Alcohol/week: 0.0 oz  . Drug use: No     Allergies   Dust  mite extract; Other; Peanuts [peanut oil]; and Tomato   Review of Systems Review of Systems Ten systems reviewed and are negative for acute change, except as noted in the HPI.    Physical Exam Updated Vital Signs BP 121/64 (BP Location: Right Arm)   Pulse 85   Temp 98.5 F (36.9 C) (Oral)   Resp 17   LMP 09/02/2017 (Exact Date)   SpO2 99%   Physical Exam  Physical Exam  Nursing note and vitals reviewed. Constitutional: She is oriented to person, place, and time. She appears well-developed and well-nourished. No distress.  HENT:  Head: Normocephalic and atraumatic.  Eyes: Conjunctivae normal and EOM are normal. Pupils are equal, round, and reactive to light. No scleral icterus.  Neck: Normal range of motion.  Cardiovascular: Normal rate, regular rhythm and normal heart sounds.  Exam reveals no gallop and no friction rub.   No murmur heard. Pulmonary/Chest: Effort normal and breath sounds normal. No respiratory distress.  Abdominal: Soft. Bowel sounds are normal. She exhibits no distension and no mass. There is no tenderness. There is no guarding.  Neurological: She is alert and oriented to person, place, and time.  Musculoskeletal: Bilateral lumbar paraspinal tenderness, worse with twisting.  No CVA tenderness. Skin: Skin is warm and dry. She is not diaphoretic.    ED Treatments / Results  Labs (all labs ordered are listed, but only abnormal results are displayed) Labs Reviewed  URINALYSIS, ROUTINE W REFLEX MICROSCOPIC - Abnormal; Notable for the following components:      Result Value   APPearance HAZY (*)    Hgb urine dipstick LARGE (*)    Ketones, ur 20 (*)    Bacteria, UA RARE (*)    All other components within normal limits  POC URINE PREG, ED    EKG None  Radiology No results found.  Procedures Procedures (including critical care time)  Medications Ordered in ED Medications - No data to display   Initial Impression / Assessment and Plan / ED Course    I have reviewed the triage vital signs and the nursing notes.  Pertinent labs & imaging results that were available during my care of the patient were reviewed by me and considered in my medical decision making (see chart for details).    Patient without vaginal complaints, hematuria present and back pain although this is likely musculoskeletal will treat with single dose of fosfomycin.  I advised patient that she needs to schedule an appoint with her primary care physician and they can set her up for an outpatient ultrasound and that most primary care doctors  do not have ultrasound equipment in the office.  The patient also is not having any abdominal pain today therefore did not feel that it needs assessment at this time.  Patient appears appropriate for discharge at this time  Final Clinical Impressions(s) / ED Diagnoses   Final diagnoses:  None    ED Discharge Orders    None       Arthor Captain, PA-C 09/27/17 1256    Mackuen, Cindee Salt, MD 09/27/17 [phone]

## 2017-12-09 ENCOUNTER — Emergency Department (HOSPITAL_COMMUNITY): Payer: Self-pay

## 2017-12-09 ENCOUNTER — Emergency Department (HOSPITAL_COMMUNITY)
Admission: EM | Admit: 2017-12-09 | Discharge: 2017-12-09 | Disposition: A | Discharge status: Home / Self Care | Payer: Self-pay | Attending: Emergency Medicine | Admitting: Emergency Medicine

## 2017-12-09 ENCOUNTER — Encounter (HOSPITAL_COMMUNITY): Payer: Self-pay | Admitting: Emergency Medicine

## 2017-12-09 ENCOUNTER — Other Ambulatory Visit: Payer: Self-pay

## 2017-12-09 DIAGNOSIS — Z87891 Personal history of nicotine dependence: Secondary | ICD-10-CM | POA: Insufficient documentation

## 2017-12-09 DIAGNOSIS — Z91010 Allergy to peanuts: Secondary | ICD-10-CM | POA: Insufficient documentation

## 2017-12-09 DIAGNOSIS — J4541 Moderate persistent asthma with (acute) exacerbation: Secondary | ICD-10-CM | POA: Insufficient documentation

## 2017-12-09 DIAGNOSIS — Z79899 Other long term (current) drug therapy: Secondary | ICD-10-CM | POA: Insufficient documentation

## 2017-12-09 MED ORDER — IPRATROPIUM-ALBUTEROL 0.5-2.5 (3) MG/3ML IN SOLN
3.0000 mL | Freq: Once | RESPIRATORY_TRACT | Status: AC
  Administered 2017-12-09: 3 mL via RESPIRATORY_TRACT
  Filled 2017-12-09: qty 3

## 2017-12-09 MED ORDER — PREDNISONE 20 MG PO TABS
40.0000 mg | ORAL_TABLET | Freq: Once | ORAL | Status: AC
  Administered 2017-12-09: 40 mg via ORAL
  Filled 2017-12-09: qty 2

## 2017-12-09 MED ORDER — ALBUTEROL SULFATE HFA 108 (90 BASE) MCG/ACT IN AERS
2.0000 | INHALATION_SPRAY | Freq: Once | RESPIRATORY_TRACT | Status: AC
  Administered 2017-12-09: 2 via RESPIRATORY_TRACT
  Filled 2017-12-09: qty 6.7

## 2017-12-09 MED ORDER — ALBUTEROL SULFATE HFA 108 (90 BASE) MCG/ACT IN AERS: 2.0000 | INHALATION_SPRAY | RESPIRATORY_TRACT | 3 refills | Status: DC | PRN

## 2017-12-09 MED ORDER — PREDNISONE 20 MG PO TABS: 40.0000 mg | ORAL_TABLET | Freq: Every day | ORAL | 0 refills | Status: DC

## 2017-12-09 NOTE — ED Triage Notes (Signed)
Pt states having cough, congestion and difficulty with asthma x 3 days.  Wheezing throughout. Pt states using her inhaler at home yesterday and this morning with no relief.

## 2017-12-09 NOTE — Discharge Instructions (Signed)
Albuterol inhaler, 2 puffs every 4 hours as needed Redness own 40 mg daily for 5 days Medical exam in the next 2 weeks for a recheck if still having wheezing Emergency department for severe or worsening difficulty breathing fever chest pain

## 2017-12-09 NOTE — ED Provider Notes (Signed)
[facility] EMERGENCY DEPARTMENT Provider Note   CSN: [id number] Arrival date & time: 12/09/17  1704     History   Chief Complaint Chief Complaint  Patient presents with  . Asthma    HPI [facility] is a 22 y.o. female.  HPI  The patient is a 22 year old female, she has a history of asthma, she has a rescue inhaler that she uses as needed and over the last couple of days has had to use this more frequently for shortness of breath and wheezing which is persistent, does not seem to be getting better with the inhaler, is associated with a mild cough but also with a mild runny and congested nose.  No fever, no swelling of the legs, she ran out of her inhaler this morning.  She has not been on prednisone recently.  Her last visit to the emergency department was several months ago, her last visit for asthma was in May.  Past Medical History:  Diagnosis Date  . Allergy   . Asthma   . Contraceptive management 01/07/2015    Patient Active Problem List   Diagnosis Date Noted  . Mild intermittent asthma 02/11/2015  . Allergic rhinitis due to pollen 02/11/2015  . Oral allergy syndrome 02/11/2015  . Contraceptive management 01/07/2015  . Dyspnea 12/24/2012  . Hypoxemia 12/24/2012  . Caregiver smokes indoors and in family car 12/24/2012  . Mild intermittent asthma with acute exacerbation in pediatric patient 12/24/2012    History reviewed. No pertinent surgical history.   OB History    Gravida  0   Para  0   Term  0   Preterm  0   AB  0   Living  0     SAB  0   TAB  0   Ectopic  0   Multiple  0   Live Births               Home Medications    Prior to Admission medications   Medication Sig Start Date End Date Taking? Authorizing Provider  albuterol (PROVENTIL HFA;VENTOLIN HFA) 108 (90 Base) MCG/ACT inhaler Inhale 2 puffs into the lungs every 4 (four) hours as needed for wheezing or shortness of breath. 12/09/17   [name], MD  EPINEPHrine (EPIPEN  IJ) Inject 0.3 mg as directed as needed (FOR SEVERE LIFE-THREATENING ALLERGIC REACTION). Reported on 05/01/2015    [provider]  fexofenadine-pseudoephedrine (ALLEGRA-D) 60-120 MG 12 hr tablet Take 1 tablet by mouth every 12 (twelve) hours. 08/12/17   Burgess Amor, PA-C  fluticasone (FLONASE) 50 MCG/ACT nasal spray Place 2 sprays into both nostrils daily. Patient taking differently: Place 2 sprays into both nostrils daily as needed for allergies or rhinitis.  05/05/17   Couture, Cortni S, PA-C  montelukast (SINGULAIR) 10 MG tablet Take 1 tablet (10 mg total) by mouth at bedtime. 07/19/17 08/18/17  [name], MD  predniSONE (DELTASONE) 20 MG tablet Take 2 tablets (40 mg total) by mouth daily. 12/09/17   [name], MD  Norethindrone-Ethinyl Estradiol-Fe Biphas (LO LOESTRIN FE) 1 MG-10 MCG / 10 MCG tablet Take 1 tablet by mouth daily. Take 1 daily by mouth 01/07/15 05/01/15  Adline Potter, NP    Family History Family History  Problem Relation Age of Onset  . Diabetes Sister   . Diabetes Brother   . Asthma Brother   . Cancer Father        colon, lung  . Hypertension Maternal Grandmother   .  Diabetes Maternal Grandmother   . Arthritis Maternal Grandmother   . Hypertension Maternal Grandfather     Social History Social History   Tobacco Use  . Smoking status: Former Smoker    Types: Cigars    Last attempt to quit: 01/06/2015    Years since quitting: 2.9  . Smokeless tobacco: Never Used  Substance Use Topics  . Alcohol use: No    Alcohol/week: 0.0 standard drinks  . Drug use: No     Allergies   Dust mite extract; Other; Peanuts [peanut oil]; and Tomato   Review of Systems Review of Systems  Constitutional: Negative for fever.  HENT: Positive for congestion and rhinorrhea. Negative for sore throat.   Respiratory: Positive for cough, shortness of breath and wheezing.      Physical Exam Updated Vital Signs BP 115/69 (BP Location: Right Arm)   Pulse 94   Temp  98.2 F (36.8 C) (Oral)   Resp (!) 21   Ht 1.6 m (5\' 3" )   Wt 95.3 kg   SpO2 98%   BMI 37.20 kg/m   Physical Exam  Constitutional: She appears well-developed and well-nourished. No distress.  HENT:  Head: Normocephalic and atraumatic.  Mouth/Throat: Oropharynx is clear and moist. No oropharyngeal exudate.  Eyes: Pupils are equal, round, and reactive to light. Conjunctivae and EOM are normal. Right eye exhibits no discharge. Left eye exhibits no discharge. No scleral icterus.  Neck: Normal range of motion. Neck supple. No JVD present. No thyromegaly present.  Cardiovascular: Normal rate, regular rhythm, normal heart sounds and intact distal pulses. Exam reveals no gallop and no friction rub.  No murmur heard. Pulmonary/Chest: Effort normal. No respiratory distress. She has wheezes. She has no rales.  Wheezing in all lung fields on both inspiration and expiration, speaks in full sentences with no increased work of breathing or respiratory distress.  Oxygen saturation of 98% on room air  Abdominal: Soft. Bowel sounds are normal. She exhibits no distension and no mass. There is no tenderness.  Musculoskeletal: Normal range of motion. She exhibits no edema or tenderness.  Lymphadenopathy:    She has no cervical adenopathy.  Neurological: She is alert. Coordination normal.  Skin: Skin is warm and dry. No rash noted. No erythema.  Psychiatric: She has a normal mood and affect. Her behavior is normal.  Nursing note and vitals reviewed.    ED Treatments / Results  Labs (all labs ordered are listed, but only abnormal results are displayed) Labs Reviewed - No data to display  EKG None  Radiology Dg Chest 2 View  Result Date: 12/09/2017 CLINICAL DATA:  Cough and congestion for 3 days. EXAM: CHEST - 2 VIEW COMPARISON:  May 05, 2017 FINDINGS: The heart size and mediastinal contours are within normal limits. Both lungs are clear. The visualized skeletal structures are unremarkable.  IMPRESSION: No active cardiopulmonary disease. Electronically Signed   By: Sherian Rein M.D.   On: 12/09/2017 17:24    Procedures Procedures (including critical care time)  Medications Ordered in ED Medications  albuterol (PROVENTIL HFA;VENTOLIN HFA) 108 (90 Base) MCG/ACT inhaler 2 puff (has no administration in time range)  predniSONE (DELTASONE) tablet 40 mg (has no administration in time range)  ipratropium-albuterol (DUONEB) 0.5-2.5 (3) MG/3ML nebulizer solution 3 mL (3 mLs Nebulization Given 12/09/17 1727)     Initial Impression / Assessment and Plan / ED Course  I have reviewed the triage vital signs and the nursing notes.  Pertinent labs & imaging results that were  available during my care of the patient were reviewed by me and considered in my medical decision making (see chart for details).     X-ray normal Lung exam consistent with asthma exacerbation The patient thinks that she may have slept with her air conditioning in her ceiling fan on which can do this to her occasionally. She requests an inhaler here as she does not have money to buy one at home. She will be given prednisone, albuterol, she will be stable for discharge and follow-up, she was instructed to use the medications as prescribed.  She expressed understanding.  Stable at this time  Final Clinical Impressions(s) / ED Diagnoses   Final diagnoses:  Moderate persistent asthma with exacerbation    ED Discharge Orders         Ordered    predniSONE (DELTASONE) 20 MG tablet  Daily     12/09/17 1734    albuterol (PROVENTIL HFA;VENTOLIN HFA) 108 (90 Base) MCG/ACT inhaler  Every 4 hours PRN     12/09/17 1734           [name], MD 12/09/17 1735

## 2017-12-29 ENCOUNTER — Other Ambulatory Visit: Payer: Self-pay

## 2017-12-29 ENCOUNTER — Encounter (HOSPITAL_COMMUNITY): Payer: Self-pay

## 2017-12-29 ENCOUNTER — Emergency Department (HOSPITAL_COMMUNITY)
Admission: EM | Admit: 2017-12-29 | Discharge: 2017-12-29 | Disposition: A | Discharge status: Home / Self Care | Payer: Self-pay | Attending: Emergency Medicine | Admitting: Emergency Medicine

## 2017-12-29 DIAGNOSIS — Z79899 Other long term (current) drug therapy: Secondary | ICD-10-CM | POA: Insufficient documentation

## 2017-12-29 DIAGNOSIS — Z91010 Allergy to peanuts: Secondary | ICD-10-CM | POA: Insufficient documentation

## 2017-12-29 DIAGNOSIS — Z87891 Personal history of nicotine dependence: Secondary | ICD-10-CM | POA: Insufficient documentation

## 2017-12-29 DIAGNOSIS — J4541 Moderate persistent asthma with (acute) exacerbation: Secondary | ICD-10-CM | POA: Insufficient documentation

## 2017-12-29 MED ORDER — ALBUTEROL SULFATE (2.5 MG/3ML) 0.083% IN NEBU
2.5000 mg | INHALATION_SOLUTION | Freq: Once | RESPIRATORY_TRACT | Status: AC
  Administered 2017-12-29: 2.5 mg via RESPIRATORY_TRACT
  Filled 2017-12-29: qty 3

## 2017-12-29 MED ORDER — ALBUTEROL SULFATE (5 MG/ML) 0.5% IN NEBU: 2.5000 mg | INHALATION_SOLUTION | RESPIRATORY_TRACT | 12 refills | Status: DC | PRN

## 2017-12-29 MED ORDER — ALBUTEROL SULFATE HFA 108 (90 BASE) MCG/ACT IN AERS
2.0000 | INHALATION_SPRAY | Freq: Once | RESPIRATORY_TRACT | Status: AC
  Administered 2017-12-29: 2 via RESPIRATORY_TRACT
  Filled 2017-12-29: qty 6.7

## 2017-12-29 MED ORDER — IPRATROPIUM-ALBUTEROL 0.5-2.5 (3) MG/3ML IN SOLN
3.0000 mL | Freq: Once | RESPIRATORY_TRACT | Status: AC
  Administered 2017-12-29: 3 mL via RESPIRATORY_TRACT
  Filled 2017-12-29: qty 3

## 2017-12-29 MED ORDER — DEXAMETHASONE 4 MG PO TABS
8.0000 mg | ORAL_TABLET | Freq: Once | ORAL | Status: AC
  Administered 2017-12-29: 8 mg via ORAL
  Filled 2017-12-29: qty 2

## 2017-12-29 NOTE — ED Provider Notes (Signed)
[facility] EMERGENCY DEPARTMENT Provider Note   CSN: [id number] Arrival date & time: 12/29/17  0053     History   Chief Complaint Chief Complaint  Patient presents with  . Flu like symptoms    HPI [facility] is a 22 y.o. female.  The history is provided by the patient.  Cough  This is a new problem. The current episode started more than 2 days ago. The problem has been gradually worsening. The cough is productive of sputum. There has been no fever. Associated symptoms include chills, rhinorrhea, myalgias, shortness of breath and wheezing. Pertinent negatives include no chest pain and no sore throat. Treatments tried: albuterol. The treatment provided mild relief. She is not a smoker.  Presents with cough.  She reports having flulike symptoms.  She reports rhinorrhea, cough, myalgias.  No chest pain.  No recorded fever.  She reports coughing up clear sputum.  No hemoptysis.  Seen earlier this month for similar episode, had some improvement after treatment for asthma but then her symptoms worsened.  Past Medical History:  Diagnosis Date  . Allergy   . Asthma   . Contraceptive management 01/07/2015    Patient Active Problem List   Diagnosis Date Noted  . Mild intermittent asthma 02/11/2015  . Allergic rhinitis due to pollen 02/11/2015  . Oral allergy syndrome 02/11/2015  . Contraceptive management 01/07/2015  . Dyspnea 12/24/2012  . Hypoxemia 12/24/2012  . Caregiver smokes indoors and in family car 12/24/2012  . Mild intermittent asthma with acute exacerbation in pediatric patient 12/24/2012    History reviewed. No pertinent surgical history.   OB History    Gravida  0   Para  0   Term  0   Preterm  0   AB  0   Living  0     SAB  0   TAB  0   Ectopic  0   Multiple  0   Live Births               Home Medications    Prior to Admission medications   Medication Sig Start Date End Date Taking? Authorizing Provider  albuterol (PROVENTIL  HFA;VENTOLIN HFA) 108 (90 Base) MCG/ACT inhaler Inhale 2 puffs into the lungs every 4 (four) hours as needed for wheezing or shortness of breath. 12/09/17   [name], MD  EPINEPHrine (EPIPEN IJ) Inject 0.3 mg as directed as needed (FOR SEVERE LIFE-THREATENING ALLERGIC REACTION). Reported on 05/01/2015    [provider]  fexofenadine-pseudoephedrine (ALLEGRA-D) 60-120 MG 12 hr tablet Take 1 tablet by mouth every 12 (twelve) hours. 08/12/17   Burgess Amor, PA-C  fluticasone (FLONASE) 50 MCG/ACT nasal spray Place 2 sprays into both nostrils daily. Patient taking differently: Place 2 sprays into both nostrils daily as needed for allergies or rhinitis.  05/05/17   Couture, Cortni S, PA-C  montelukast (SINGULAIR) 10 MG tablet Take 1 tablet (10 mg total) by mouth at bedtime. 07/19/17 08/18/17  [name], MD  predniSONE (DELTASONE) 20 MG tablet Take 2 tablets (40 mg total) by mouth daily. 12/09/17   [name], MD  Norethindrone-Ethinyl Estradiol-Fe Biphas (LO LOESTRIN FE) 1 MG-10 MCG / 10 MCG tablet Take 1 tablet by mouth daily. Take 1 daily by mouth 01/07/15 05/01/15  Adline Potter, NP    Family History Family History  Problem Relation Age of Onset  . Diabetes Sister   . Diabetes Brother   . Asthma Brother   . Cancer Father  colon, lung  . Hypertension Maternal Grandmother   . Diabetes Maternal Grandmother   . Arthritis Maternal Grandmother   . Hypertension Maternal Grandfather     Social History Social History   Tobacco Use  . Smoking status: Former Smoker    Types: Cigars    Last attempt to quit: 01/06/2015    Years since quitting: 2.9  . Smokeless tobacco: Never Used  Substance Use Topics  . Alcohol use: No    Alcohol/week: 0.0 standard drinks  . Drug use: No     Allergies   Dust mite extract; Other; Peanuts [peanut oil]; and Tomato   Review of Systems Review of Systems  Constitutional: Positive for chills. Negative for fatigue.  HENT: Positive for  rhinorrhea. Negative for sore throat.   Respiratory: Positive for cough, shortness of breath and wheezing.   Cardiovascular: Negative for chest pain.  Musculoskeletal: Positive for back pain and myalgias.  All other systems reviewed and are negative.    Physical Exam Updated Vital Signs BP (!) 146/88 (BP Location: Right Arm) Comment: Simultaneous filing. User may not have seen previous data.  Pulse 88 Comment: Simultaneous filing. User may not have seen previous data.  Temp 98 F (36.7 C) (Oral)   Resp 18   Ht 1.6 m (5\' 3" )   Wt 95.3 kg   SpO2 98%   BMI 37.20 kg/m   Physical Exam CONSTITUTIONAL: Well developed/well nourished HEAD: Normocephalic/atraumatic EYES: EOMI/PERRL ENMT: Mucous membranes moist NECK: supple no meningeal signs SPINE/BACK:entire spine nontender CV: S1/S2 noted, no murmurs/rubs/gallops noted LUNGS: Wheezing bilaterally, no apparent distress ABDOMEN: soft, nontender, no rebound or guarding, bowel sounds noted throughout abdomen GU:no cva tenderness NEURO: Pt is awake/alert/appropriate, moves all extremitiesx4.  No facial droop.   EXTREMITIES: pulses normal/equal, full ROM, no lower extremity edema SKIN: warm, color normal PSYCH: no abnormalities of mood noted, alert and oriented to situation   ED Treatments / Results  Labs (all labs ordered are listed, but only abnormal results are displayed) Labs Reviewed - No data to display  EKG None  Radiology No results found.  Procedures Procedures    Medications Ordered in ED Medications  albuterol (PROVENTIL HFA;VENTOLIN HFA) 108 (90 Base) MCG/ACT inhaler 2 puff (2 puffs Inhalation Given 12/29/17 0225)  albuterol (PROVENTIL) (2.5 MG/3ML) 0.083% nebulizer solution 2.5 mg (2.5 mg Nebulization Given 12/29/17 0225)  ipratropium-albuterol (DUONEB) 0.5-2.5 (3) MG/3ML nebulizer solution 3 mL (3 mLs Nebulization Given 12/29/17 0226)  dexamethasone (DECADRON) tablet 8 mg (8 mg Oral Given 12/29/17 0216)      Initial Impression / Assessment and Plan / ED Course  I have reviewed the triage vital signs and the nursing notes.      2:46 AM Seen earlier this month for asthma exacerbation, now presenting with cough and wheezing.  She is in no distress.    She is improved, no acute distress.  Pulse ox is appropriate.  She feels appropriate for discharge home. She was given a one-time dose of Decadron, and she will continue albuterol at home.  We discussed strict return precautions.  She reports all of her pain is resolved. Final Clinical Impressions(s) / ED Diagnoses   Final diagnoses:  Moderate persistent asthma with exacerbation    ED Discharge Orders         Ordered    albuterol (PROVENTIL) (5 MG/ML) 0.5% nebulizer solution  Every 4 hours PRN     12/29/17 0257           Zadie Rhine, MD 12/29/17  0315  

## 2017-12-29 NOTE — ED Notes (Signed)
Pt states she feels better now after respiratory tx

## 2017-12-29 NOTE — ED Triage Notes (Signed)
Pt was here 9/5 with cough, congestion, asthma issues. symptoms has not improved since then. Also says she feels hot then cold.   Also complaining of back pain due to the coughing.  Says she coughs up loose mucus.

## 2018-02-21 IMAGING — DX DG CHEST 2V
2 series · 2 of 2 positions shown · non-contrast
Comparison: 09/14/2016

CLINICAL DATA: Wheezing and shortness of breath after breathing
hair dye. History of asthma.

EXAM:
CHEST  2 VIEW

[chest pa]
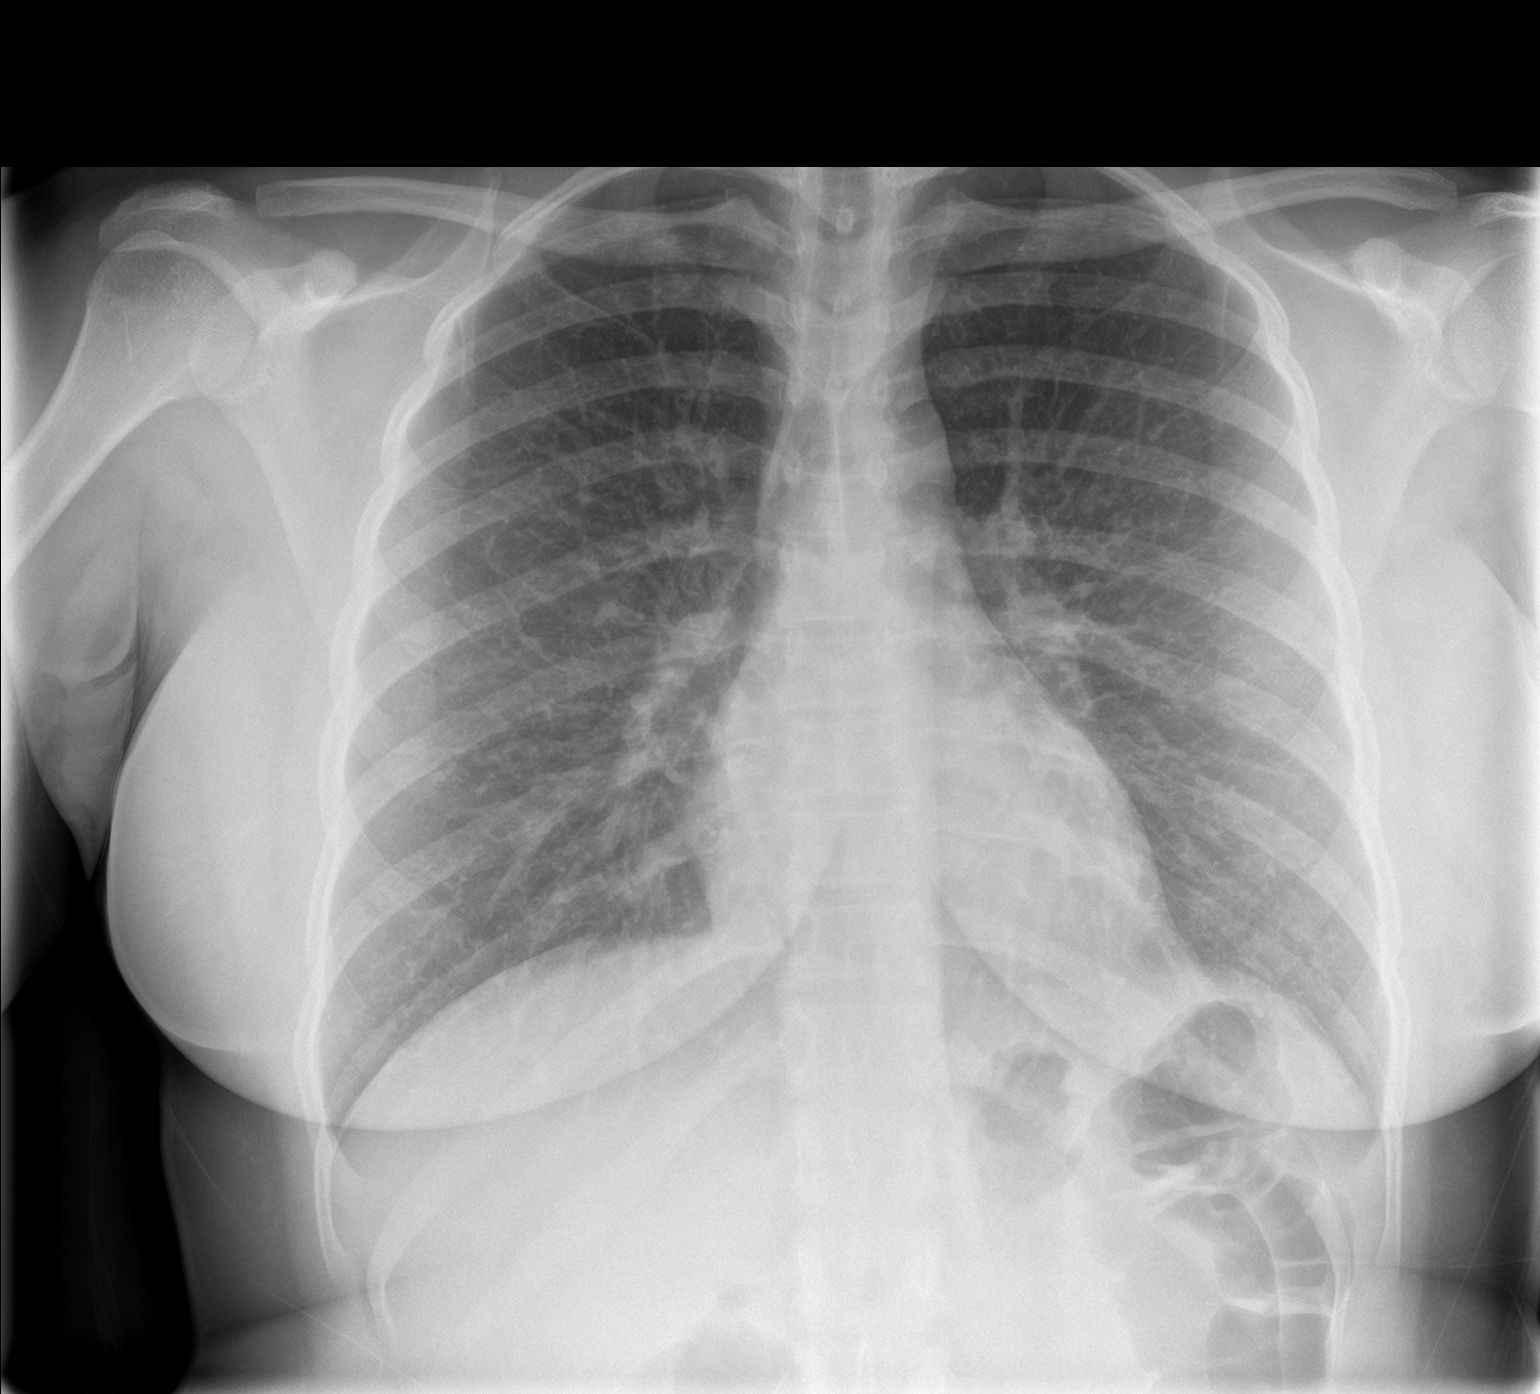

[chest lat]
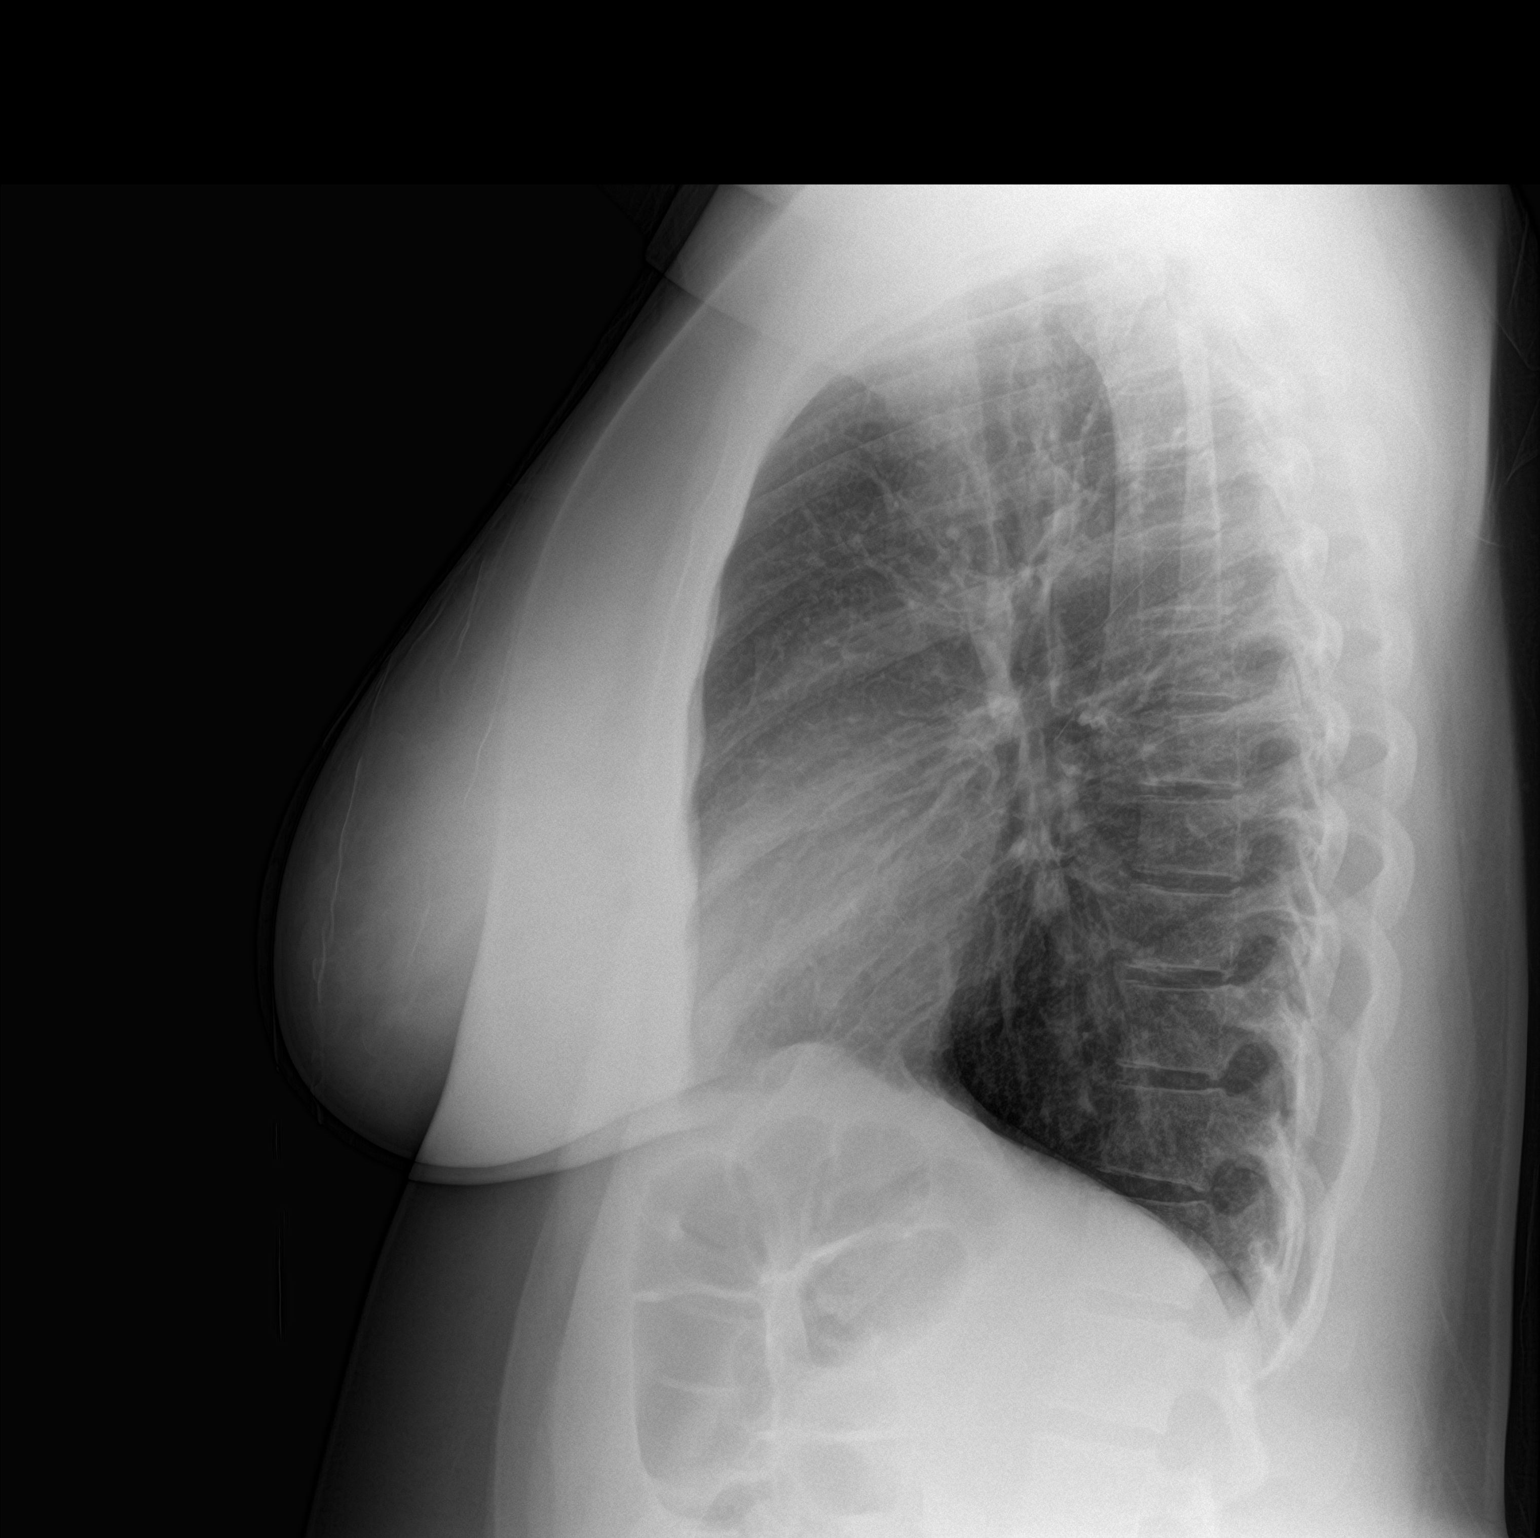

[2 of 2 positions shown; findings below may reference images not displayed]

FINDINGS: Normal heart size and mediastinal contours. No acute infiltrate or
edema. No effusion or pneumothorax. No acute osseous findings.
IMPRESSION: Negative chest.

## 2019-01-10 LAB — OB RESULTS CONSOLE HEPATITIS B SURFACE ANTIGEN: Hepatitis B Surface Ag: NEGATIVE

## 2019-01-10 LAB — OB RESULTS CONSOLE RPR: RPR: NONREACTIVE

## 2019-01-10 LAB — OB RESULTS CONSOLE ANTIBODY SCREEN: Antibody Screen: NEGATIVE

## 2019-01-10 LAB — OB RESULTS CONSOLE VARICELLA ZOSTER ANTIBODY, IGG: Varicella: IMMUNE

## 2019-01-10 LAB — OB RESULTS CONSOLE RUBELLA ANTIBODY, IGM: Rubella: IMMUNE

## 2019-01-10 LAB — OB RESULTS CONSOLE GC/CHLAMYDIA
Chlamydia: NEGATIVE
Gonorrhea: NEGATIVE

## 2019-01-10 LAB — OB RESULTS CONSOLE ABO/RH: RH Type: POSITIVE

## 2019-01-10 LAB — OB RESULTS CONSOLE HIV ANTIBODY (ROUTINE TESTING): HIV: NONREACTIVE

## 2019-01-11 ENCOUNTER — Telehealth: Payer: Self-pay

## 2019-01-11 DIAGNOSIS — O0990 Supervision of high risk pregnancy, unspecified, unspecified trimester: Secondary | ICD-10-CM

## 2019-01-11 NOTE — Telephone Encounter (Signed)
Nurse from [facility] left VM message requesting a new OB appt and MFM dating [place] for this pt per Dr. Ilda Basset. States pt was found to be pregnant with triplets and Dr. Ilda Basset would like to transfer care to our office.

## 2019-01-12 ENCOUNTER — Encounter (HOSPITAL_COMMUNITY): Payer: Self-pay | Admitting: *Deleted

## 2019-01-12 NOTE — Telephone Encounter (Signed)
Contacted [facility] for dating information. Based on earlier [place] pt is 7w 5d today with EDD of 08/26/18.   [place] ordered and [place] appt scheduled with MFM on 01/16/19 at 12:45PM.   Called to notify pt of appt.  Notified pt front office will be calling to schedule OB intake and new OB appt. Answered pt's questions about location and what to expect at first appts. Pt verbalizes understanding and has no other questions at this time.   [place] office notified to schedule OB intake and new OB appt.

## 2019-01-16 ENCOUNTER — Encounter (HOSPITAL_COMMUNITY): Payer: Self-pay | Admitting: *Deleted

## 2019-01-16 ENCOUNTER — Other Ambulatory Visit: Payer: Self-pay

## 2019-01-16 ENCOUNTER — Ambulatory Visit (HOSPITAL_COMMUNITY): Payer: Medicaid Other | Admitting: *Deleted

## 2019-01-16 ENCOUNTER — Ambulatory Visit (HOSPITAL_COMMUNITY)
Admission: RE | Admit: 2019-01-16 | Discharge: 2019-01-16 | Disposition: A | Discharge status: Home / Self Care | Payer: Medicaid Other | Source: Ambulatory Visit | Attending: Obstetrics and Gynecology | Admitting: Obstetrics and Gynecology

## 2019-01-16 ENCOUNTER — Other Ambulatory Visit (HOSPITAL_COMMUNITY): Payer: Self-pay | Admitting: *Deleted

## 2019-01-16 VITALS — BP 133/73 | HR 85 | Temp 98.9°F | Ht 64.5 in | Wt 183.0 lb

## 2019-01-16 DIAGNOSIS — O0990 Supervision of high risk pregnancy, unspecified, unspecified trimester: Secondary | ICD-10-CM | POA: Insufficient documentation

## 2019-01-16 DIAGNOSIS — O30101 Triplet pregnancy, unspecified number of placenta and unspecified number of amniotic sacs, first trimester: Secondary | ICD-10-CM | POA: Diagnosis not present

## 2019-01-16 DIAGNOSIS — Z3A09 9 weeks gestation of pregnancy: Secondary | ICD-10-CM | POA: Diagnosis not present

## 2019-01-16 DIAGNOSIS — O30109 Triplet pregnancy, unspecified number of placenta and unspecified number of amniotic sacs, unspecified trimester: Secondary | ICD-10-CM | POA: Diagnosis present

## 2019-01-16 DIAGNOSIS — R825 Elevated urine levels of drugs, medicaments and biological substances: Secondary | ICD-10-CM | POA: Insufficient documentation

## 2019-01-16 DIAGNOSIS — Z362 Encounter for other antenatal screening follow-up: Secondary | ICD-10-CM

## 2019-01-18 ENCOUNTER — Telehealth: Payer: Self-pay | Admitting: Emergency Medicine

## 2019-01-18 NOTE — Telephone Encounter (Signed)
Pt called and left a message on the nurse voicemail line stating that she was trying to schedule an appointment with [facility] and she needed her ultrasound reports send to them.   Pt call was returned. Pt was informed that she would need to fill out a release of information to have her records sent to that office. Pt verbalized understanding and had no further questions.

## 2019-02-13 ENCOUNTER — Ambulatory Visit (HOSPITAL_COMMUNITY): Payer: Medicaid Other | Admitting: *Deleted

## 2019-02-13 ENCOUNTER — Encounter (HOSPITAL_COMMUNITY): Payer: Self-pay

## 2019-02-13 ENCOUNTER — Other Ambulatory Visit (HOSPITAL_COMMUNITY): Payer: Self-pay | Admitting: Obstetrics and Gynecology

## 2019-02-13 ENCOUNTER — Other Ambulatory Visit (HOSPITAL_COMMUNITY): Payer: Self-pay | Admitting: *Deleted

## 2019-02-13 ENCOUNTER — Ambulatory Visit (HOSPITAL_COMMUNITY)
Admission: RE | Admit: 2019-02-13 | Discharge: 2019-02-13 | Disposition: A | Discharge status: Home / Self Care | Payer: Medicaid Other | Source: Ambulatory Visit | Attending: Obstetrics and Gynecology | Admitting: Obstetrics and Gynecology

## 2019-02-13 ENCOUNTER — Other Ambulatory Visit: Payer: Self-pay

## 2019-02-13 VITALS — BP 127/74 | HR 117 | Temp 99.1°F

## 2019-02-13 DIAGNOSIS — Z362 Encounter for other antenatal screening follow-up: Secondary | ICD-10-CM | POA: Insufficient documentation

## 2019-02-13 DIAGNOSIS — Z3A13 13 weeks gestation of pregnancy: Secondary | ICD-10-CM | POA: Diagnosis not present

## 2019-02-13 DIAGNOSIS — O30101 Triplet pregnancy, unspecified number of placenta and unspecified number of amniotic sacs, first trimester: Secondary | ICD-10-CM

## 2019-02-13 DIAGNOSIS — O021 Missed abortion: Secondary | ICD-10-CM | POA: Diagnosis not present

## 2019-02-13 DIAGNOSIS — O0990 Supervision of high risk pregnancy, unspecified, unspecified trimester: Secondary | ICD-10-CM | POA: Diagnosis present

## 2019-02-13 DIAGNOSIS — O3120X0 Continuing pregnancy after intrauterine death of one fetus or more, unspecified trimester, not applicable or unspecified: Secondary | ICD-10-CM

## 2019-02-14 ENCOUNTER — Encounter (HOSPITAL_COMMUNITY): Payer: Self-pay

## 2019-02-14 ENCOUNTER — Inpatient Hospital Stay (HOSPITAL_COMMUNITY)
Admission: EM | Admit: 2019-02-14 | Discharge: 2019-02-15 | Disposition: A | Discharge status: Home / Self Care | Payer: Medicaid Other | Attending: Family Medicine | Admitting: Family Medicine

## 2019-02-14 ENCOUNTER — Other Ambulatory Visit: Payer: Self-pay

## 2019-02-14 DIAGNOSIS — Z87891 Personal history of nicotine dependence: Secondary | ICD-10-CM | POA: Diagnosis not present

## 2019-02-14 DIAGNOSIS — R109 Unspecified abdominal pain: Secondary | ICD-10-CM | POA: Insufficient documentation

## 2019-02-14 DIAGNOSIS — Z3A13 13 weeks gestation of pregnancy: Secondary | ICD-10-CM | POA: Insufficient documentation

## 2019-02-14 DIAGNOSIS — O26891 Other specified pregnancy related conditions, first trimester: Secondary | ICD-10-CM | POA: Diagnosis not present

## 2019-02-14 DIAGNOSIS — O2341 Unspecified infection of urinary tract in pregnancy, first trimester: Secondary | ICD-10-CM

## 2019-02-14 LAB — URINALYSIS, ROUTINE W REFLEX MICROSCOPIC
Bacteria, UA: NONE SEEN
Bilirubin Urine: NEGATIVE
Glucose, UA: NEGATIVE mg/dL
Hgb urine dipstick: NEGATIVE
Ketones, ur: NEGATIVE mg/dL
Nitrite: NEGATIVE
Protein, ur: NEGATIVE mg/dL
Specific Gravity, Urine: 1.029 (ref 1.005–1.030)
pH: 6 (ref 5.0–8.0)

## 2019-02-14 LAB — WET PREP, GENITAL
Clue Cells Wet Prep HPF POC: NONE SEEN
Sperm: NONE SEEN
Trich, Wet Prep: NONE SEEN
Yeast Wet Prep HPF POC: NONE SEEN

## 2019-02-14 NOTE — ED Triage Notes (Addendum)
Pt presents to ED, [redacted] weeks pregnant with lower abdominal cramping which radiates to her back started this morning. Pt denies N/V but states had some diarrhea. Pt denies fever. Pt was seen at [facility] yesterday and was told she is a twin pregnancy but one fetus does not have a heartbeat. Pt denies vaginal bleeding or intercourse.

## 2019-02-14 NOTE — MAU Note (Addendum)
Pt states she was at [facility] and left to come here. Pt states she is having abdominal pain and lower back pain and HA. Pt reports a increase in vaginal discharge that is white. Pt reports the pain is fine right now but gets up to a 8/10. Pt states this was a triplet pregnancy but it is now only one live baby. Pt states they told her the others would "disappear" but with the pain she got concerned.

## 2019-02-15 DIAGNOSIS — O2341 Unspecified infection of urinary tract in pregnancy, first trimester: Secondary | ICD-10-CM

## 2019-02-15 DIAGNOSIS — Z3A13 13 weeks gestation of pregnancy: Secondary | ICD-10-CM

## 2019-02-15 LAB — COMPREHENSIVE METABOLIC PANEL
ALT: 14 U/L (ref 0–44)
AST: 16 U/L (ref 15–41)
Albumin: 3.5 g/dL (ref 3.5–5.0)
Alkaline Phosphatase: 40 U/L (ref 38–126)
Anion gap: 10 (ref 5–15)
BUN: 11 mg/dL (ref 6–20)
CO2: 19 mmol/L — ABNORMAL LOW (ref 22–32)
Calcium: 9.1 mg/dL (ref 8.9–10.3)
Chloride: 105 mmol/L (ref 98–111)
Creatinine, Ser: 0.54 mg/dL (ref 0.44–1.00)
GFR calc Af Amer: >60 mL/min (ref >60)
GFR calc non Af Amer: >60 mL/min (ref >60)
Glucose, Bld: 74 mg/dL (ref 70–99)
Potassium: 3.8 mmol/L (ref 3.5–5.1)
Sodium: 134 mmol/L — ABNORMAL LOW (ref 135–145)
Total Bilirubin: 0.1 mg/dL — ABNORMAL LOW (ref 0.3–1.2)
Total Protein: 6.8 g/dL (ref 6.5–8.1)

## 2019-02-15 LAB — CBC
HCT: 34.9 % — ABNORMAL LOW (ref 36.0–46.0)
Hemoglobin: 11.7 g/dL — ABNORMAL LOW (ref 12.0–15.0)
MCH: 28.3 pg (ref 26.0–34.0)
MCHC: 33.5 g/dL (ref 30.0–36.0)
MCV: 84.5 fL (ref 80.0–100.0)
Platelets: 301 10*3/uL (ref 150–400)
RBC: 4.13 MIL/uL (ref 3.87–5.11)
RDW: 12.6 % (ref 11.5–15.5)
WBC: 7.4 10*3/uL (ref 4.0–10.5)
nRBC: 0 % (ref 0.0–0.2)

## 2019-02-15 MED ORDER — AMOXICILLIN-POT CLAVULANATE 875-125 MG PO TABS: 1.0000 | ORAL_TABLET | Freq: Two times a day (BID) | ORAL | 0 refills | Status: AC

## 2019-02-15 NOTE — Discharge Instructions (Signed)
Pregnancy and Urinary Tract Infection ° °A urinary tract infection (UTI) is an infection of any part of the urinary tract. This includes the kidneys, the tubes that connect your kidneys to your bladder (ureters), the bladder, and the tube that carries urine out of your body (urethra). These organs make, store, and get rid of urine in the body. Your health care provider may use other names to describe the infection. An upper UTI affects the ureters and kidneys (pyelonephritis). A lower UTI affects the bladder (cystitis) and urethra (urethritis). °Most urinary tract infections are caused by bacteria in your genital area, around the entrance to your urinary tract (urethra). These bacteria grow and cause irritation and inflammation of your urinary tract. You are more likely to develop a UTI during pregnancy because the physical and hormonal changes your body goes through can make it easier for bacteria to get into your urinary tract. Your growing baby also puts pressure on your bladder and can affect urine flow. It is important to recognize and treat UTIs in pregnancy because of the risk of serious complications for both you and your baby. °How does this affect me? °Symptoms of a UTI include: °· Needing to urinate right away (urgently). °· Frequent urination or passing small amounts of urine frequently. °· Pain or burning with urination. °· Blood in the urine. °· Urine that smells bad or unusual. °· Trouble urinating. °· Cloudy urine. °· Pain in the abdomen or lower back. °· Vaginal discharge. °You may also have: °· Vomiting or a decreased appetite. °· Confusion. °· Irritability or tiredness. °· A fever. °· Diarrhea. °How does this affect my baby? °An untreated UTI during pregnancy could lead to a kidney infection or a systemic infection, which can cause health problems that could affect your baby. Possible complications of an untreated UTI include: °· Giving birth to your baby before 37 weeks of pregnancy  (premature). °· Having a baby with a low birth weight. °· Developing high blood pressure during pregnancy (preeclampsia). °· Having a low hemoglobin level (anemia). °What can I do to lower my risk? °To prevent a UTI: °· Go to the bathroom as soon as you feel the need. Do not hold urine for long periods of time. °· Always wipe from front to back, especially after a bowel movement. Use each tissue one time when you wipe. °· Empty your bladder after sex. °· Keep your genital area dry. °· Drink 6-10 glasses of water each day. °· Do not douche or use deodorant sprays. °How is this treated? °Treatment for this condition may include: °· Antibiotic medicines that are safe to take during pregnancy. °· Other medicines to treat less common causes of UTI. °Follow these instructions at home: °· If you were prescribed an antibiotic medicine, take it as told by your health care provider. Do not stop using the antibiotic even if you start to feel better. °· Keep all follow-up visits as told by your health care provider. This is important. °Contact a health care provider if: °· Your symptoms do not improve or they get worse. °· You have abnormal vaginal discharge. °Get help right away if you: °· Have a fever. °· Have nausea and vomiting. °· Have back or side pain. °· Feel contractions in your uterus. °· Have lower belly pain. °· Have a gush of fluid from your vagina. °· Have blood in your urine. °Summary °· A urinary tract infection (UTI) is an infection of any part of the urinary tract, which includes the   kidneys, ureters, bladder, and urethra. °· Most urinary tract infections are caused by bacteria in your genital area, around the entrance to your urinary tract (urethra). °· You are more likely to develop a UTI during pregnancy. °· If you were prescribed an antibiotic medicine, take it as told by your health care provider. Do not stop using the antibiotic even if you start to feel better. °This information is not intended to  replace advice given to you by your health care provider. Make sure you discuss any questions you have with your health care provider. °Document Released: 07/18/2010 Document Revised: 07/15/2018 Document Reviewed: 02/24/2018 °Elsevier Patient Education © 2020 [employer] ° °

## 2019-02-15 NOTE — MAU Provider Note (Signed)
History     CSN: [id number]  Arrival date and time: 02/14/19 8144   First Provider Initiated Contact with Patient 02/14/19 2322      Chief Complaint  Patient presents with  . Abdominal Pain   HPI  Elizabeth Stuart is a 23 yo G1P0 at 13.3 EGA who is presenting to MAU with abdominal pain and increase in vaginal discharge. She says her pain started yesterday, and she hasn't taken anything for it. It is intermittent, and she doesn't have it right now, but it is reproducible if she presses on her lower abdomen. She endorses dysuria and suprapubic tenderness. She denies hematuria or vaginal bleeding.  OB History    Gravida  1   Para  0   Term  0   Preterm  0   AB  0   Living  0     SAB  0   TAB  0   Ectopic  0   Multiple  0   Live Births              Past Medical History:  Diagnosis Date  . Allergy   . Asthma   . Contraceptive management 01/07/2015    Past Surgical History:  Procedure Laterality Date  . WISDOM TOOTH EXTRACTION      Family History  Problem Relation Age of Onset  . Diabetes Sister   . Diabetes Brother   . Asthma Brother   . Cancer Father        colon, lung  . Hypertension Maternal Grandmother   . Diabetes Maternal Grandmother   . Arthritis Maternal Grandmother   . Hypertension Maternal Grandfather     Social History   Tobacco Use  . Smoking status: Former Smoker    Types: Cigars    Quit date: 01/06/2015    Years since quitting: 4.1  . Smokeless tobacco: Never Used  Substance Use Topics  . Alcohol use: No    Alcohol/week: 0.0 standard drinks  . Drug use: No    Allergies:  Allergies  Allergen Reactions  . Dust Mite Extract Other (See Comments)    sneezing  . Other Hives    Allergic to Downy; Grass-sneezing, congestion, itchy throat; Apples, cucumbers-hives, TREE NUTS- ASTHMA ATTACK, POTATOES- HAND SWELLING.  . Peanuts [Peanut Oil] Other (See Comments)    Asthma attack  . Tomato     Wheezing. EARS BREAK OUT    No medications  prior to admission.    Review of Systems  All other systems reviewed and are negative.  Physical Exam   Blood pressure 128/69, pulse 91, temperature 98.8 F (37.1 C), temperature source Oral, resp. rate 18, height 5\' 3"  (1.6 m), weight 85.6 kg, last menstrual period 11/13/2018, SpO2 97 %.  Physical Exam  Nursing note and vitals reviewed. Constitutional: She is oriented to person, place, and time. She appears well-developed and well-nourished.  HENT:  Head: Normocephalic and atraumatic.  Eyes: Pupils are equal, round, and reactive to light. Conjunctivae and EOM are normal.  Neck: Normal range of motion. Neck supple.  Cardiovascular: Normal rate, regular rhythm, normal heart sounds and intact distal pulses.  GI: Soft. Bowel sounds are normal. She exhibits no distension and no mass. There is abdominal tenderness (suprapubic). There is no rebound and no guarding.  Genitourinary:    Vagina and uterus normal.   Musculoskeletal: Normal range of motion.  Neurological: She is alert and oriented to person, place, and time. She has normal reflexes.  Skin: Skin is warm and  dry.  Psychiatric: She has a normal mood and affect. Her behavior is normal. Judgment and thought content normal.    MAU Course  Procedures  MDM -CMP, CBC, GC/CT, Wet Prep -UA done at [facility] suspicious for UTI (esp with suprapubic tenderness) -UC sent  Results for orders placed or performed during the hospital encounter of 02/14/19 (from the past 24 hour(s))  Urinalysis, Routine w reflex microscopic     Status: Abnormal   Collection Time: 02/14/19  7:34 PM  Result Value Ref Range   Color, Urine YELLOW YELLOW   APPearance CLOUDY (A) CLEAR   Specific Gravity, Urine 1.029 1.005 - 1.030   pH 6.0 5.0 - 8.0   Glucose, UA NEGATIVE NEGATIVE mg/dL   Hgb urine dipstick NEGATIVE NEGATIVE   Bilirubin Urine NEGATIVE NEGATIVE   Ketones, ur NEGATIVE NEGATIVE mg/dL   Protein, ur NEGATIVE NEGATIVE mg/dL   Nitrite NEGATIVE NEGATIVE    Leukocytes,Ua TRACE (A) NEGATIVE   RBC / HPF 0-5 0 - 5 RBC/hpf   WBC, UA 6-10 0 - 5 WBC/hpf   Bacteria, UA NONE SEEN NONE SEEN   Squamous Epithelial / LPF 21-50 0 - 5   Mucus PRESENT   Wet prep, genital     Status: Abnormal   Collection Time: 02/14/19 11:30 PM   Specimen: Vaginal  Result Value Ref Range   Yeast Wet Prep HPF POC NONE SEEN NONE SEEN   Trich, Wet Prep NONE SEEN NONE SEEN   Clue Cells Wet Prep HPF POC NONE SEEN NONE SEEN   WBC, Wet Prep HPF POC FEW (A) NONE SEEN   Sperm NONE SEEN   Comprehensive metabolic panel     Status: Abnormal   Collection Time: 02/14/19 11:36 PM  Result Value Ref Range   Sodium 134 (L) 135 - 145 mmol/L   Potassium 3.8 3.5 - 5.1 mmol/L   Chloride 105 98 - 111 mmol/L   CO2 19 (L) 22 - 32 mmol/L   Glucose, Bld 74 70 - 99 mg/dL   BUN 11 6 - 20 mg/dL   Creatinine, Ser 0.54 0.44 - 1.00 mg/dL   Calcium 9.1 8.9 - 10.3 mg/dL   Total Protein 6.8 6.5 - 8.1 g/dL   Albumin 3.5 3.5 - 5.0 g/dL   AST 16 15 - 41 U/L   ALT 14 0 - 44 U/L   Alkaline Phosphatase 40 38 - 126 U/L   Total Bilirubin 0.1 (L) 0.3 - 1.2 mg/dL   GFR calc non Af Amer >60 >60 mL/min   GFR calc Af Amer >60 >60 mL/min   Anion gap 10 5 - 15  CBC     Status: Abnormal   Collection Time: 02/14/19 11:36 PM  Result Value Ref Range   WBC 7.4 4.0 - 10.5 K/uL   RBC 4.13 3.87 - 5.11 MIL/uL   Hemoglobin 11.7 (L) 12.0 - 15.0 g/dL   HCT 34.9 (L) 36.0 - 46.0 %   MCV 84.5 80.0 - 100.0 fL   MCH 28.3 26.0 - 34.0 pg   MCHC 33.5 30.0 - 36.0 g/dL   RDW 12.6 11.5 - 15.5 %   Platelets 301 150 - 400 K/uL   nRBC 0.0 0.0 - 0.2 %    Assessment and Plan  23 yo G1P0 at 13.3 EGA with abdominal pain and suprapubic tenderness -[facility] from [facility] suspicious for UTI, will empirically treat with Augmentin -UC sent -DC home with return precautions -Follow up with OB as scheduled  Marvella Jenning L Vian Fluegel 02/15/2019, 2:06  AM  

## 2019-02-16 ENCOUNTER — Telehealth: Payer: Self-pay | Admitting: Women's Health

## 2019-02-16 LAB — CULTURE, OB URINE

## 2019-02-16 LAB — GC/CHLAMYDIA PROBE AMP (~~LOC~~) NOT AT ARMC
Chlamydia: NEGATIVE
Comment: NEGATIVE
Comment: NORMAL
Neisseria Gonorrhea: NEGATIVE

## 2019-02-16 NOTE — Telephone Encounter (Signed)

## 2019-02-17 ENCOUNTER — Ambulatory Visit (INDEPENDENT_AMBULATORY_CARE_PROVIDER_SITE_OTHER): Payer: Medicaid Other | Admitting: Women's Health

## 2019-02-17 ENCOUNTER — Other Ambulatory Visit: Payer: Medicaid Other

## 2019-02-17 ENCOUNTER — Ambulatory Visit: Payer: Medicaid Other | Admitting: *Deleted

## 2019-02-17 ENCOUNTER — Other Ambulatory Visit: Payer: Self-pay

## 2019-02-17 ENCOUNTER — Encounter: Payer: Self-pay | Admitting: Women's Health

## 2019-02-17 VITALS — BP 128/77 | HR 95

## 2019-02-17 DIAGNOSIS — O3090 Multiple gestation, unspecified, unspecified trimester: Secondary | ICD-10-CM | POA: Insufficient documentation

## 2019-02-17 DIAGNOSIS — Z1389 Encounter for screening for other disorder: Secondary | ICD-10-CM | POA: Diagnosis not present

## 2019-02-17 DIAGNOSIS — Z331 Pregnant state, incidental: Secondary | ICD-10-CM

## 2019-02-17 DIAGNOSIS — O30131 Triplet pregnancy, trichorionic/triamniotic, first trimester: Secondary | ICD-10-CM

## 2019-02-17 DIAGNOSIS — Z3401 Encounter for supervision of normal first pregnancy, first trimester: Secondary | ICD-10-CM

## 2019-02-17 DIAGNOSIS — Z1371 Encounter for nonprocreative screening for genetic disease carrier status: Secondary | ICD-10-CM

## 2019-02-17 DIAGNOSIS — Z130 Encounter for screening for diseases of the blood and blood-forming organs and certain disorders involving the immune mechanism: Secondary | ICD-10-CM

## 2019-02-17 DIAGNOSIS — Z36 Encounter for antenatal screening for chromosomal anomalies: Secondary | ICD-10-CM

## 2019-02-17 DIAGNOSIS — O0990 Supervision of high risk pregnancy, unspecified, unspecified trimester: Secondary | ICD-10-CM

## 2019-02-17 DIAGNOSIS — Z3A13 13 weeks gestation of pregnancy: Secondary | ICD-10-CM

## 2019-02-17 DIAGNOSIS — Z1379 Encounter for other screening for genetic and chromosomal anomalies: Secondary | ICD-10-CM

## 2019-02-17 DIAGNOSIS — Z0283 Encounter for blood-alcohol and blood-drug test: Secondary | ICD-10-CM

## 2019-02-17 DIAGNOSIS — O0991 Supervision of high risk pregnancy, unspecified, first trimester: Secondary | ICD-10-CM

## 2019-02-17 LAB — POCT URINALYSIS DIPSTICK OB
Blood, UA: NEGATIVE
Glucose, UA: NEGATIVE
Ketones, UA: NEGATIVE
Leukocytes, UA: NEGATIVE
Nitrite, UA: NEGATIVE
POC,PROTEIN,UA: NEGATIVE

## 2019-02-17 MED ORDER — BLOOD PRESSURE MONITOR MISC: 0 refills | Status: DC

## 2019-02-17 NOTE — Progress Notes (Signed)
INITIAL OBSTETRICAL VISIT Patient name: Elizabeth Stuart MRN [id number]  Date of birth: 02-13-1996 Chief Complaint:   Initial Prenatal Visit  History of Present Illness:   Elizabeth Stuart is a 23 y.o. G67P0000 African American female at [redacted]w[redacted]d by LMP c/w 8wk u/s, with an Estimated Date of Delivery: 08/20/19 being seen today for her initial obstetrical visit.   Her obstetrical history is significant for primigravida.   Started pnc in [place] , [facility] [place], then had u/s w/ MFM d/t trichorionic/triamniotic pregnancy on 10/12- Baby A had absent FCA w/ CRL c/w [redacted]w[redacted]d. Baby B had +FCA and CRL c/w [redacted]w[redacted]d, Baby C had +FCA and CRL c/w [redacted]w[redacted]d.  On 11/9 she had u/s w/ MFM that revealed Baby C absent FCA and CRL c/w [redacted]w[redacted]d. Baby B still viable. Dr. Donalee Citrin recommended no CFDA/genetic screening d/t demises. Went to [facility]U 11/11 w/ abd pain, tx emperically w/ augmentin for presumed UTI, urine cx resulted multiple species, no GBS.  Today she reports no complaints.  Patient's last menstrual period was 11/13/2018 (approximate). Last pap 01/10/19. Results were: normal Review of Systems:   Pertinent items are noted in HPI Denies cramping/contractions, leakage of fluid, vaginal bleeding, abnormal vaginal discharge w/ itching/odor/irritation, headaches, visual changes, shortness of breath, chest pain, abdominal pain, severe nausea/vomiting, or problems with urination or bowel movements unless otherwise stated above.  Pertinent History Reviewed:  Reviewed past medical,surgical, social, obstetrical and family history.  Reviewed problem list, medications and allergies. OB History  Gravida Para Term Preterm AB Living  1 0 0 0 0 0  SAB TAB Ectopic Multiple Live Births  0 0 0 0      # Outcome Date GA Lbr Len/2nd Weight Sex Delivery Anes PTL Lv  1 Current            Physical Assessment:   Vitals:   02/17/19 1109  BP: 128/77  Pulse: 95  There is no height or weight on file to calculate BMI.       Physical Examination:  General appearance - well appearing, and in no distress  Mental status - alert, oriented to person, place, and time  Psych:  She has a normal mood and affect  Skin - warm and dry, normal color, no suspicious lesions noted  Chest - effort normal, all lung fields clear to auscultation bilaterally  Heart - normal rate and regular rhythm  Abdomen - soft, nontender  Extremities:  No swelling or varicosities noted  Thin prep pap is not done  TODAY'S FHT Baby B: +FCA on informal transabdominal u/s, +active fetus, Baby A&C w/o FCA  Results for orders placed or performed in visit on 02/17/19 (from the past 24 hour(s))  POC Urinalysis Dipstick OB   Collection Time: 02/17/19 11:30 AM  Result Value Ref Range   Color, UA     Clarity, UA     Glucose, UA Negative Negative   Bilirubin, UA     Ketones, UA neg    Spec Grav, UA     Blood, UA neg    pH, UA     POC,PROTEIN,UA Negative Negative, Trace, Small (1+), Moderate (2+), Large (3+), 4+   Urobilinogen, UA     Nitrite, UA neg    Leukocytes, UA Negative Negative   Appearance     Odor      Assessment & Plan:  1) High-Risk Pregnancy G1P0000 at [redacted]w[redacted]d with an Estimated Date of Delivery: 08/20/19   2) Initial OB  visit  3) Trichorionic/Triamniotic gestation w/ demise of baby A &C, start ASA daily, keep anatomy u/s on 12/14 w/ MFM per [facility]  Meds:  Meds ordered this encounter  Medications  . Blood Pressure Monitor MISC    Sig: For regular home bp monitoring during pregnancy    Dispense:  1 each    Refill:  0    Z34.90    Initial labs obtained Continue prenatal vitamins Reviewed n/v relief measures and warning s/s to report Reviewed recommended weight gain based on pre-gravid BMI Encouraged well-balanced diet Genetic Screening discussed: per Dr. Judeth Cornfield do not do, d/t demise x 2 Cystic fibrosis, SMA, Fragile X screening discussed requested Ultrasound discussed; fetal survey: requested [facility] completed>PCM not  here, form faxed The nature of [facility] [employer] with multiple MDs and other Advanced Practice Providers was explained to patient; also emphasized that fellows, residents, and students are part of our team. Does not have home bp cuff. Rx faxed to [facility]. Check bp weekly, let us know if >140/90.   Follow-up: Return in about 4 weeks (around 03/17/2019) for [facility]OB, in person, MD only.   Orders Placed This Encounter  Procedures  . Sickle Cell Scr  . Inheritest Core(CF97,SMA,FraX)  . OB RESULTS CONSOLE GC/Chlamydia  . OB RESULTS CONSOLE RPR  . OB RESULTS CONSOLE HIV antibody  . OB RESULTS CONSOLE Rubella Antibody  . OB RESULTS CONSOLE Varicella zoster antibody, IgG  . OB RESULTS CONSOLE Hepatitis B surface antigen  . Pain Management Screening Profile (10S)  . POC Urinalysis Dipstick OB  . OB RESULTS CONSOLE ABO/Rh  . OB RESULTS CONSOLE Antibody Screen    Cheral Marker CNM, [facility] 02/17/2019 2:03 PM

## 2019-02-17 NOTE — Patient Instructions (Addendum)
Elizabeth Stuart, I greatly value your feedback.  If you receive a survey following your visit with [place] today, we appreciate you taking the time to fill it out.  Thanks, Elizabeth Stuart, CNM, WHNP-BC  Begin taking a 81mg  baby aspirin daily to decrease the risk of preeclampsia during pregnancy   [place]!!! It is now [facility] [place] at [facility] ([place], [place] [address]) Entrance located off of [address] parking   Nausea & Vomiting  Have saltine crackers or pretzels by your bed and eat a few bites before you raise your head out of bed in the morning  Eat small frequent meals throughout the day instead of large meals  Drink plenty of fluids throughout the day to stay hydrated, just don't drink a lot of fluids with your meals.  This can make your stomach fill up faster making you feel sick  Do not brush your teeth right after you eat  Products with real ginger are good for nausea, like ginger ale and ginger hard candy Make sure it says made with real ginger!  Sucking on sour candy like lemon heads is also good for nausea  If your prenatal vitamins make you nauseated, take them at night so you will sleep through the nausea  Sea Bands  If you feel like you need medicine for the nausea & vomiting please let us know  If you are unable to keep any fluids or food down please let us know   Constipation  Drink plenty of fluid, preferably water, throughout the day  Eat foods high in fiber such as fruits, vegetables, and grains  Exercise, such as walking, is a good way to keep your bowels regular  Drink warm fluids, especially warm prune juice, or decaf coffee  Eat a 1/2 cup of real oatmeal (not instant), 1/2 cup applesauce, and 1/2-1 cup warm prune juice every day  If needed, you may take Colace (docusate sodium) stool softener once or twice a day to help keep the stool soft.   If you still are having problems with  constipation, you may take Miralax once daily as needed to help keep your bowels regular.   Home Blood Pressure Monitoring for Patients   Your provider has recommended that you check your blood pressure (BP) at least once a week at home. If you do not have a blood pressure cuff at home, one will be provided for you. Contact your provider if you have not received your monitor within 1 week.   Helpful Tips for Accurate Home Blood Pressure Checks  . Don't smoke, exercise, or drink caffeine 30 minutes before checking your BP . Use the restroom before checking your BP (a full bladder can raise your pressure) . Relax in a comfortable upright chair . Feet on the ground . Left arm resting comfortably on a flat surface at the level of your heart . Legs uncrossed . Back supported . Sit quietly and don't talk . Place the cuff on your bare arm . Adjust snuggly, so that only two fingertips can fit between your skin and the top of the cuff . Check 2 readings separated by at least one minute . Keep a log of your BP readings . For a visual, please reference this diagram: http://ccnc.care/bpdiagram  Provider Name: [facility]     Phone: [phone]  Zone 1: ALL CLEAR  Continue to monitor your symptoms:  . BP reading is  less than 140 (top number) or less than 90 (bottom number)  . No right upper stomach pain . No headaches or seeing spots . No feeling nauseated or throwing up . No swelling in face and hands  Zone 2: CAUTION Call your doctor's office for any of the following:  . BP reading is greater than 140 (top number) or greater than 90 (bottom number)  . Stomach pain under your ribs in the middle or right side . Headaches or seeing spots . Feeling nauseated or throwing up . Swelling in face and hands  Zone 3: EMERGENCY  Seek immediate medical care if you have any of the following:  . BP reading is greater than160 (top number) or greater than 110 (bottom number) . Severe headaches  not improving with Tylenol . Serious difficulty catching your breath . Any worsening symptoms from Zone 2    First Trimester of Pregnancy The first trimester of pregnancy is from week 1 until the end of week 12 (months 1 through 3). A week after a sperm fertilizes an egg, the egg will implant on the wall of the uterus. This embryo will begin to develop into a baby. Genes from you and your partner are forming the baby. The female genes determine whether the baby is a boy or a girl. At 6-8 weeks, the eyes and face are formed, and the heartbeat can be seen on ultrasound. At the end of 12 weeks, all the baby's organs are formed.  Now that you are pregnant, you will want to do everything you can to have a healthy baby. Two of the most important things are to get good prenatal care and to follow your health care provider's instructions. Prenatal care is all the medical care you receive before the baby's birth. This care will help prevent, find, and treat any problems during the pregnancy and childbirth. BODY CHANGES Your body goes through many changes during pregnancy. The changes vary from woman to woman.   You may gain or lose a couple of pounds at first.  You may feel sick to your stomach (nauseous) and throw up (vomit). If the vomiting is uncontrollable, call your health care provider.  You may tire easily.  You may develop headaches that can be relieved by medicines approved by your health care provider.  You may urinate more often. Painful urination may mean you have a bladder infection.  You may develop heartburn as a result of your pregnancy.  You may develop constipation because certain hormones are causing the muscles that push waste through your intestines to slow down.  You may develop hemorrhoids or swollen, bulging veins (varicose veins).  Your breasts may begin to grow larger and become tender. Your nipples may stick out more, and the tissue that surrounds them (areola) may become  darker.  Your gums may bleed and may be sensitive to brushing and flossing.  Dark spots or blotches (chloasma, mask of pregnancy) may develop on your face. This will likely fade after the baby is born.  Your menstrual periods will stop.  You may have a loss of appetite.  You may develop cravings for certain kinds of food.  You may have changes in your emotions from day to day, such as being excited to be pregnant or being concerned that something may go wrong with the pregnancy and baby.  You may have more vivid and strange dreams.  You may have changes in your hair. These can include thickening of your hair, rapid growth,  and changes in texture. Some women also have hair loss during or after pregnancy, or hair that feels dry or thin. Your hair will most likely return to normal after your baby is born. WHAT TO EXPECT AT YOUR PRENATAL VISITS During a routine prenatal visit:  You will be weighed to make sure you and the baby are growing normally.  Your blood pressure will be taken.  Your abdomen will be measured to track your baby's growth.  The fetal heartbeat will be listened to starting around week 10 or 12 of your pregnancy.  Test results from any previous visits will be discussed. Your health care provider may ask you:  How you are feeling.  If you are feeling the baby move.  If you have had any abnormal symptoms, such as leaking fluid, bleeding, severe headaches, or abdominal cramping.  If you have any questions. Other tests that may be performed during your first trimester include:  Blood tests to find your blood type and to check for the presence of any previous infections. They will also be used to check for low iron levels (anemia) and Rh antibodies. Later in the pregnancy, blood tests for diabetes will be done along with other tests if problems develop.  Urine tests to check for infections, diabetes, or protein in the urine.  An ultrasound to confirm the proper  growth and development of the baby.  An amniocentesis to check for possible genetic problems.  Fetal screens for spina bifida and Down syndrome.  You may need other tests to make sure you and the baby are doing well. HOME CARE INSTRUCTIONS  Medicines  Follow your health care provider's instructions regarding medicine use. Specific medicines may be either safe or unsafe to take during pregnancy.  Take your prenatal vitamins as directed.  If you develop constipation, try taking a stool softener if your health care provider approves. Diet  Eat regular, well-balanced meals. Choose a variety of foods, such as meat or vegetable-based protein, fish, milk and low-fat dairy products, vegetables, fruits, and whole grain breads and cereals. Your health care provider will help you determine the amount of weight gain that is right for you.  Avoid raw meat and uncooked cheese. These carry germs that can cause birth defects in the baby.  Eating four or five small meals rather than three large meals a day may help relieve nausea and vomiting. If you start to feel nauseous, eating a few soda crackers can be helpful. Drinking liquids between meals instead of during meals also seems to help nausea and vomiting.  If you develop constipation, eat more high-fiber foods, such as fresh vegetables or fruit and whole grains. Drink enough fluids to keep your urine clear or pale yellow. Activity and Exercise  Exercise only as directed by your health care provider. Exercising will help you:  Control your weight.  Stay in shape.  Be prepared for labor and delivery.  Experiencing pain or cramping in the lower abdomen or low back is a good sign that you should stop exercising. Check with your health care provider before continuing normal exercises.  Try to avoid standing for long periods of time. Move your legs often if you must stand in one place for a long time.  Avoid heavy lifting.  Wear low-heeled  shoes, and practice good posture.  You may continue to have sex unless your health care provider directs you otherwise. Relief of Pain or Discomfort  Wear a good support bra for breast tenderness.  Take warm sitz baths to soothe any pain or discomfort caused by hemorrhoids. Use hemorrhoid cream if your health care provider approves.    Rest with your legs elevated if you have leg cramps or low back pain.  If you develop varicose veins in your legs, wear support hose. Elevate your feet for 15 minutes, 3-4 times a day. Limit salt in your diet. Prenatal Care  Schedule your prenatal visits by the twelfth week of pregnancy. They are usually scheduled monthly at first, then more often in the last 2 months before delivery.  Write down your questions. Take them to your prenatal visits.  Keep all your prenatal visits as directed by your health care provider. Safety  Wear your seat belt at all times when driving.  Make a list of emergency phone numbers, including numbers for family, friends, the hospital, and police and fire departments. General Tips  Ask your health care provider for a referral to a local prenatal education class. Begin classes no later than at the beginning of month 6 of your pregnancy.  Ask for help if you have counseling or nutritional needs during pregnancy. Your health care provider can offer advice or refer you to specialists for help with various needs.  Do not use hot tubs, steam rooms, or saunas.  Do not douche or use tampons or scented sanitary pads.  Do not cross your legs for long periods of time.  Avoid cat litter boxes and soil used by cats. These carry germs that can cause birth defects in the baby and possibly loss of the fetus by miscarriage or stillbirth.  Avoid all smoking, herbs, alcohol, and medicines not prescribed by your health care provider. Chemicals in these affect the formation and growth of the baby.  Schedule a dentist appointment. At  home, brush your teeth with a soft toothbrush and be gentle when you floss. SEEK MEDICAL CARE IF:   You have dizziness.  You have mild pelvic cramps, pelvic pressure, or nagging pain in the abdominal area.  You have persistent nausea, vomiting, or diarrhea.  You have a bad smelling vaginal discharge.  You have pain with urination.  You notice increased swelling in your face, hands, legs, or ankles. SEEK IMMEDIATE MEDICAL CARE IF:   You have a fever.  You are leaking fluid from your vagina.  You have spotting or bleeding from your vagina.  You have severe abdominal cramping or pain.  You have rapid weight gain or loss.  You vomit blood or material that looks like coffee grounds.  You are exposed to [place] measles and have never had them.  You are exposed to fifth disease or chickenpox.  You develop a severe headache.  You have shortness of breath.  You have any kind of trauma, such as from a fall or a car accident. Document Released: 03/17/2001 Document Revised: 08/07/2013 Document Reviewed: 01/31/2013 [facility] Patient Information 2015 [place], [place]. This information is not intended to replace advice given to you by your health care provider. Make sure you discuss any questions you have with your health care provider.  Coronavirus (COVID-19) Are you at risk?  Are you at risk for the Coronavirus (COVID-19)?  To be considered HIGH RISK for Coronavirus (COVID-19), you have to meet the following criteria:  . Traveled to [place], [place], [place], [place] or [place]; or in the [place] to [place], [place], [place], or [place]; and have fever, cough, and shortness of breath within the last 2 weeks of travel  OR . Been in close contact with a person diagnosed with COVID-19 within the last 2 weeks and have fever, cough, and shortness of breath . IF YOU DO NOT MEET THESE CRITERIA, YOU ARE CONSIDERED LOW RISK FOR COVID-19.  What to do if you are HIGH RISK for  COVID-19?  Marland Kitchen If you are having a medical emergency, call 911. . Seek medical care right away. Before you go to a doctor's office, urgent care or emergency department, call ahead and tell them about your recent travel, contact with someone diagnosed with COVID-19, and your symptoms. You should receive instructions from your physician's office regarding next steps of care.  . When you arrive at healthcare provider, tell the healthcare staff immediately you have returned from visiting [place], [place], [place], [place] or [place]; or traveled in the [place] to [place], [place], [place], or [place]; in the last two weeks or you have been in close contact with a person diagnosed with COVID-19 in the last 2 weeks.   . Tell the health care staff about your symptoms: fever, cough and shortness of breath. . After you have been seen by a medical provider, you will be either: o Tested for (COVID-19) and discharged home on quarantine except to seek medical care if symptoms worsen, and asked to  - Stay home and avoid contact with others until you get your results (4-5 days)  - Avoid travel on public transportation if possible (such as bus, train, or airplane) or o Sent to the Emergency Department by EMS for evaluation, COVID-19 testing, and possible admission depending on your condition and test results.  What to do if you are LOW RISK for COVID-19?  Reduce your risk of any infection by using the same precautions used for avoiding the common cold or flu:  Marland Kitchen Wash your hands often with soap and warm water for at least 20 seconds.  If soap and water are not readily available, use an alcohol-based hand sanitizer with at least 60% alcohol.  . If coughing or sneezing, cover your mouth and nose by coughing or sneezing into the elbow areas of your shirt or coat, into a tissue or into your sleeve (not your hands). . Avoid shaking hands with others and consider head nods or verbal greetings only. . Avoid  touching your eyes, nose, or mouth with unwashed hands.  . Avoid close contact with people who are sick. . Avoid places or events with large numbers of people in one location, like concerts or sporting events. . Carefully consider travel plans you have or are making. . If you are planning any travel outside or inside the [place], visit the CDC's Travelers' Health webpage for the latest health notices. . If you have some symptoms but not all symptoms, continue to monitor at home and seek medical attention if your symptoms worsen. . If you are having a medical emergency, call 911.   [place] / e-Visit: [email or web address]         [facility]: [facility]: [phone]                   [facility]: [phone]

## 2019-02-18 LAB — SICKLE CELL SCREEN: Sickle Cell Screen: NEGATIVE

## 2019-02-18 LAB — MED LIST OPTION NOT SELECTED

## 2019-02-20 LAB — PMP SCREEN PROFILE (10S), URINE
Amphetamine Scrn, Ur: NEGATIVE ng/mL
BARBITURATE SCREEN URINE: NEGATIVE ng/mL
BENZODIAZEPINE SCREEN, URINE: NEGATIVE ng/mL
CANNABINOIDS UR QL SCN: NEGATIVE ng/mL
Cocaine (Metab) Scrn, Ur: NEGATIVE ng/mL
Creatinine(Crt), U: 151 mg/dL (ref 20.0–300.0)
Methadone Screen, Urine: NEGATIVE ng/mL
OXYCODONE+OXYMORPHONE UR QL SCN: NEGATIVE ng/mL
Opiate Scrn, Ur: NEGATIVE ng/mL
Ph of Urine: 6.2 (ref 4.5–8.9)
Phencyclidine Qn, Ur: NEGATIVE ng/mL
Propoxyphene Scrn, Ur: NEGATIVE ng/mL

## 2019-02-22 ENCOUNTER — Encounter: Payer: Medicaid Other | Admitting: Obstetrics & Gynecology

## 2019-03-06 ENCOUNTER — Encounter: Payer: Medicaid Other | Admitting: Advanced Practice Midwife

## 2019-03-06 LAB — INHERITEST CORE(CF97,SMA,FRAX)

## 2019-03-20 ENCOUNTER — Ambulatory Visit (HOSPITAL_COMMUNITY): Payer: Medicaid Other | Admitting: *Deleted

## 2019-03-20 ENCOUNTER — Other Ambulatory Visit (HOSPITAL_COMMUNITY): Payer: Self-pay | Admitting: *Deleted

## 2019-03-20 ENCOUNTER — Other Ambulatory Visit: Payer: Self-pay

## 2019-03-20 ENCOUNTER — Ambulatory Visit (HOSPITAL_COMMUNITY)
Admission: RE | Admit: 2019-03-20 | Discharge: 2019-03-20 | Disposition: A | Discharge status: Home / Self Care | Payer: Medicaid Other | Source: Ambulatory Visit | Attending: Obstetrics and Gynecology | Admitting: Obstetrics and Gynecology

## 2019-03-20 ENCOUNTER — Encounter (HOSPITAL_COMMUNITY): Payer: Self-pay

## 2019-03-20 DIAGNOSIS — O3120X0 Continuing pregnancy after intrauterine death of one fetus or more, unspecified trimester, not applicable or unspecified: Secondary | ICD-10-CM | POA: Diagnosis not present

## 2019-03-20 DIAGNOSIS — O021 Missed abortion: Secondary | ICD-10-CM

## 2019-03-20 DIAGNOSIS — O30102 Triplet pregnancy, unspecified number of placenta and unspecified number of amniotic sacs, second trimester: Secondary | ICD-10-CM

## 2019-03-20 DIAGNOSIS — Z362 Encounter for other antenatal screening follow-up: Secondary | ICD-10-CM

## 2019-03-20 DIAGNOSIS — Z3A18 18 weeks gestation of pregnancy: Secondary | ICD-10-CM | POA: Diagnosis not present

## 2019-03-20 DIAGNOSIS — O0990 Supervision of high risk pregnancy, unspecified, unspecified trimester: Secondary | ICD-10-CM | POA: Diagnosis present

## 2019-03-21 ENCOUNTER — Encounter (HOSPITAL_COMMUNITY): Payer: Self-pay | Admitting: Obstetrics & Gynecology

## 2019-03-21 ENCOUNTER — Other Ambulatory Visit: Payer: Self-pay

## 2019-03-21 ENCOUNTER — Inpatient Hospital Stay (HOSPITAL_COMMUNITY)
Admission: AD | Admit: 2019-03-21 | Discharge: 2019-03-21 | Disposition: A | Discharge status: Home / Self Care | Payer: Medicaid Other | Attending: Obstetrics & Gynecology | Admitting: Obstetrics & Gynecology

## 2019-03-21 DIAGNOSIS — O99512 Diseases of the respiratory system complicating pregnancy, second trimester: Secondary | ICD-10-CM | POA: Insufficient documentation

## 2019-03-21 DIAGNOSIS — Z20828 Contact with and (suspected) exposure to other viral communicable diseases: Secondary | ICD-10-CM | POA: Diagnosis not present

## 2019-03-21 DIAGNOSIS — J45909 Unspecified asthma, uncomplicated: Secondary | ICD-10-CM | POA: Diagnosis not present

## 2019-03-21 DIAGNOSIS — Z87891 Personal history of nicotine dependence: Secondary | ICD-10-CM | POA: Insufficient documentation

## 2019-03-21 DIAGNOSIS — Z79899 Other long term (current) drug therapy: Secondary | ICD-10-CM | POA: Diagnosis not present

## 2019-03-21 DIAGNOSIS — Z3A18 18 weeks gestation of pregnancy: Secondary | ICD-10-CM | POA: Diagnosis not present

## 2019-03-21 HISTORY — DX: Unspecified infectious disease: B99.9

## 2019-03-21 LAB — SARS CORONAVIRUS 2 (TAT 6-24 HRS): SARS Coronavirus 2: NEGATIVE

## 2019-03-21 MED ORDER — ALBUTEROL SULFATE HFA 108 (90 BASE) MCG/ACT IN AERS
2.0000 | INHALATION_SPRAY | Freq: Once | RESPIRATORY_TRACT | Status: AC
  Administered 2019-03-21: 2 via RESPIRATORY_TRACT
  Filled 2019-03-21: qty 6.7

## 2019-03-21 NOTE — MAU Provider Note (Signed)
Chief Complaint: Asthma   First Provider Initiated Contact with Patient 03/21/19 1227     SUBJECTIVE HPI: Elizabeth Stuart is a 23 y.o. G1P0000 at [redacted]w[redacted]d who presents to Maternity Admissions reporting wheezing & asthma issues. History of asthma. Was hospitalized once in 10th grade for 3 days. Has never been intubated. Has not been on meds for her asthma in over a year.  Current symptoms started about 3 days ago. Reports cough & wheezing. In the morning her cough is productive of clear phlegm but otherwise is dry. Wheezing is worse when she is lying down. Denies fever/chills, change in smell or taste, sore throat, or SOB. No sick contacts. Denies OB complaints. Takes Claritin daily but otherwise no asthma or allergy medications.    Past Medical History:  Diagnosis Date  . Allergy   . Asthma   . Infection    Uti   OB History  Gravida Para Term Preterm AB Living  1 0 0 0 0 0  SAB TAB Ectopic Multiple Live Births  0 0 0 0      # Outcome Date GA Lbr Len/2nd Weight Sex Delivery Anes PTL Lv  1 Current            Past Surgical History:  Procedure Laterality Date  . WISDOM TOOTH EXTRACTION     Social History   Socioeconomic History  . Marital status: Single    Spouse name: Damahli  . Number of children: 0  . Years of education: 2  . Highest education level: High school graduate  Occupational History  . Not on file  Tobacco Use  . Smoking status: Former Smoker    Types: Cigars    Quit date: 01/06/2015    Years since quitting: 4.2  . Smokeless tobacco: Never Used  Substance and Sexual Activity  . Alcohol use: No    Alcohol/week: 0.0 standard drinks  . Drug use: Not Currently    Types: Marijuana  . Sexual activity: Yes    Birth control/protection: None  Other Topics Concern  . Not on file  Social History Narrative  . Not on file   Social Determinants of Health   Financial Resource Strain: Low Risk   . Difficulty of Paying Living Expenses: Not hard at all  Food Insecurity:  No Food Insecurity  . Worried About Charity fundraiser in the Last Year: Never true  . Ran Out of Food in the Last Year: Never true  Transportation Needs: No Transportation Needs  . Lack of Transportation (Medical): No  . Lack of Transportation (Non-Medical): No  Physical Activity: Inactive  . Days of Exercise per Week: 0 days  . Minutes of Exercise per Session: 0 min  Stress: Stress Concern Present  . Feeling of Stress : To some extent  Social Connections: Moderately Isolated  . Frequency of Communication with Friends and Family: More than three times a week  . Frequency of Social Gatherings with Friends and Family: Once a week  . Attends Religious Services: Never  . Active Member of Clubs or Organizations: No  . Attends Archivist Meetings: Never  . Marital Status: Never married  Intimate Partner Violence: Not At Risk  . Fear of Current or Ex-Partner: No  . Emotionally Abused: No  . Physically Abused: No  . Sexually Abused: No   Family History  Problem Relation Age of Onset  . Diabetes Sister   . Diabetes Brother   . Asthma Brother   . Healthy Mother   .  Cancer Father        colon, lung  . Hypertension Maternal Grandmother   . Diabetes Maternal Grandmother   . Arthritis Maternal Grandmother   . Hypertension Maternal Grandfather    No current facility-administered medications on file prior to encounter.   Current Outpatient Medications on File Prior to Encounter  Medication Sig Dispense Refill  . loratadine (CLARITIN) 10 MG tablet Take 10 mg by mouth at bedtime.    . Prenatal Vit-Fe Fumarate-FA (PRENATAL VITAMIN PO) Take 1 tablet by mouth daily.     . Blood Pressure Monitor MISC For regular home bp monitoring during pregnancy 1 each 0  . [DISCONTINUED] Norethindrone-Ethinyl Estradiol-Fe Biphas (LO LOESTRIN FE) 1 MG-10 MCG / 10 MCG tablet Take 1 tablet by mouth daily. Take 1 daily by mouth 1 Package 11   Allergies  Allergen Reactions  . Dust Mite Extract  Other (See Comments)    sneezing  . Other Hives    Allergic to Downy; Grass-sneezing, congestion, itchy throat; Apples, cucumbers-hives, TREE NUTS- ASTHMA ATTACK, POTATOES- HAND SWELLING.  . Peanuts [Peanut Oil] Other (See Comments)    Asthma attack  . Tomato     Wheezing. EARS BREAK OUT    I have reviewed patient's Past Medical Hx, Surgical Hx, Family Hx, Social Hx, medications and allergies.   Review of Systems  Constitutional: Negative.   HENT: Negative for sore throat.   Respiratory: Positive for cough and wheezing. Negative for shortness of breath.   Gastrointestinal: Negative.     OBJECTIVE Patient Vitals for the past 24 hrs:  BP Temp Temp src Pulse Resp SpO2 Weight  03/21/19 1432 120/68 -- -- 84 16 100 % --  03/21/19 1310 -- -- -- 85 18 97 % --  03/21/19 1300 -- -- -- -- -- 97 % --  03/21/19 1250 -- -- -- -- -- 98 % --  03/21/19 1240 -- -- -- -- -- 97 % --  03/21/19 1230 -- -- -- -- -- 97 % --  03/21/19 1223 102/61 -- -- 92 -- 97 % --  03/21/19 1219 -- -- -- -- 18 98 % --  03/21/19 1150 122/63 98.5 F (36.9 C) Oral (!) 102 18 98 % 95.1 kg  03/21/19 1148 -- -- -- -- -- 100 % --   Constitutional: Well-developed, well-nourished female in no acute distress.  Cardiovascular: normal rate & rhythm, no murmur Respiratory: Bilateral expiratory wheezing.  normal rate and effort. GI:  Abd soft, non-tender, Pos BS x 4. No guarding or rebound tenderness MS: Extremities nontender, no edema, normal ROM Neurologic: Alert and oriented x 4.     LAB RESULTS No results found for this or any previous visit (from the past 24 hour(s)).  IMAGING   MAU COURSE Orders Placed This Encounter  Procedures  . SARS CORONAVIRUS 2 (TAT 6-24 HRS) Nasopharyngeal Nasopharyngeal Swab  . Discharge patient   Meds ordered this encounter  Medications  . albuterol (VENTOLIN HFA) 108 (90 Base) MCG/ACT inhaler 2 puff    MDM FHT present via doppler  Pt afebrile & SpO2 >97% No apparent  difficulty breathing. Wheezing audible throughout lung fields.  Albuterol inhaler 2 puffs ordered. Pt reports improvement in symptoms & lungs clear throughout. Will send home with inhaler provided by pharmacy. Will give 2 puffs every 4 hours for the next 2 days. If symptoms worsen or continues to need inhaler on regular basis, will need to f/u with ED or her ob/gyn for management.   COVID test pending  ASSESSMENT 1. Asthma affecting pregnancy in second trimester   2. [redacted] weeks gestation of pregnancy     PLAN Discharge home in stable condition. Discussed reasons to return to MAU vs ED Use albuterol for the next 2 days then PRN  Follow-up Information    [facility] Follow up.   Specialty: Obstetrics and Gynecology Why: Return here for pregnancy related emergencies. For worsening asthma symptoms, present to your local emergency room Contact information: [address] [id number] Wilhemina Bonito [place] [address] [phone]         Allergies as of 03/21/2019      Reactions   Dust Mite Extract Other (See Comments)   sneezing   Other Hives   Allergic to Downy; Grass-sneezing, congestion, itchy throat; Apples, cucumbers-hives, TREE NUTS- ASTHMA ATTACK, POTATOES- HAND SWELLING.   Peanuts [peanut Oil] Other (See Comments)   Asthma attack   Tomato    Wheezing. EARS BREAK OUT      Medication List    TAKE these medications   Blood Pressure Monitor Misc For regular home bp monitoring during pregnancy   loratadine 10 MG tablet Commonly known as: CLARITIN Take 10 mg by mouth at bedtime.   PRENATAL VITAMIN PO Take 1 tablet by mouth daily.        Judeth Horn, NP 03/21/2019  3:06 PM

## 2019-03-21 NOTE — Discharge Instructions (Signed)
Albuterol inhaler - 2 puffs every 4 hours for the next 2 days. If you feel like you still need your inhaler daily after that, please follow up with your ob/gyn.    Asthma, Adult  Asthma is a long-term (chronic) condition in which the airways get tight and narrow. The airways are the breathing passages that lead from the nose and mouth down into the lungs. A person with asthma will have times when symptoms get worse. These are called asthma attacks. They can cause coughing, whistling sounds when you breathe (wheezing), shortness of breath, and chest pain. They can make it hard to breathe. There is no cure for asthma, but medicines and lifestyle changes can help control it. There are many things that can bring on an asthma attack or make asthma symptoms worse (triggers). Common triggers include:  Mold.  Dust.  Cigarette smoke.  Cockroaches.  Things that can cause allergy symptoms (allergens). These include animal skin flakes (dander) and pollen from trees or grass.  Things that pollute the air. These may include household cleaners, wood smoke, smog, or chemical odors.  Cold air, weather changes, and wind.  Crying or laughing hard.  Stress.  Certain medicines or drugs.  Certain foods such as dried fruit, potato chips, and grape juice.  Infections, such as a cold or the flu.  Certain medical conditions or diseases.  Exercise or tiring activities. Asthma may be treated with medicines and by staying away from the things that cause asthma attacks. Types of medicines may include:  Controller medicines. These help prevent asthma symptoms. They are usually taken every day.  Fast-acting reliever or rescue medicines. These quickly relieve asthma symptoms. They are used as needed and provide short-term relief.  Allergy medicines if your attacks are brought on by allergens.  Medicines to help control the body's defense (immune) system. Follow these instructions at home: Avoiding  triggers in your home  Change your heating and air conditioning filter often.  Limit your use of fireplaces and wood stoves.  Get rid of pests (such as roaches and mice) and their droppings.  Throw away plants if you see mold on them.  Clean your floors. Dust regularly. Use cleaning products that do not smell.  Have someone vacuum when you are not home. Use a vacuum cleaner with a HEPA filter if possible.  Replace carpet with wood, tile, or vinyl flooring. Carpet can trap animal skin flakes and dust.  Use allergy-proof pillows, mattress covers, and box spring covers.  Wash bed sheets and blankets every week in hot water. Dry them in a dryer.  Keep your bedroom free of any triggers.  Avoid pets and keep windows closed when things that cause allergy symptoms are in the air.  Use blankets that are made of polyester or cotton.  Clean bathrooms and kitchens with bleach. If possible, have someone repaint the walls in these rooms with mold-resistant paint. Keep out of the rooms that are being cleaned and painted.  Wash your hands often with soap and water. If soap and water are not available, use hand sanitizer.  Do not allow anyone to smoke in your home. General instructions  Take over-the-counter and prescription medicines only as told by your doctor. ? Talk with your doctor if you have questions about how or when to take your medicines. ? Make note if you need to use your medicines more often than usual.  Do not use any products that contain nicotine or tobacco, such as cigarettes and e-cigarettes. If  you need help quitting, ask your doctor.  Stay away from secondhand smoke.  Avoid doing things outdoors when allergen counts are high and when air quality is low.  Wear a ski mask when doing outdoor activities in the winter. The mask should cover your nose and mouth. Exercise indoors on cold days if you can.  Warm up before you exercise. Take time to cool down after  exercise.  Use a peak flow meter as told by your doctor. A peak flow meter is a tool that measures how well the lungs are working.  Keep track of the peak flow meter's readings. Write them down.  Follow your asthma action plan. This is a written plan for taking care of your asthma and treating your attacks.  Make sure you get all the shots (vaccines) that your doctor recommends. Ask your doctor about a flu shot and a pneumonia shot.  Keep all follow-up visits as told by your doctor. This is important. Contact a doctor if:  You have wheezing, shortness of breath, or a cough even while taking medicine to prevent attacks.  The mucus you cough up (sputum) is thicker than usual.  The mucus you cough up changes from clear or white to yellow, green, gray, or bloody.  You have problems from the medicine you are taking, such as: ? A rash. ? Itching. ? Swelling. ? Trouble breathing.  You need reliever medicines more than 2-3 times a week.  Your peak flow reading is still at 50-79% of your personal best after following the action plan for 1 hour.  You have a fever. Get help right away if:  You seem to be worse and are not responding to medicine during an asthma attack.  You are short of breath even at rest.  You get short of breath when doing very little activity.  You have trouble eating, drinking, or talking.  You have chest pain or tightness.  You have a fast heartbeat.  Your lips or fingernails start to turn blue.  You are light-headed or dizzy, or you faint.  Your peak flow is less than 50% of your personal best.  You feel too tired to breathe normally. Summary  Asthma is a long-term (chronic) condition in which the airways get tight and narrow. An asthma attack can make it hard to breathe.  Asthma cannot be cured, but medicines and lifestyle changes can help control it.  Make sure you understand how to avoid triggers and how and when to use your medicines. This  information is not intended to replace advice given to you by your health care provider. Make sure you discuss any questions you have with your health care provider. Document Released: 09/09/2007 Document Revised: 05/26/2018 Document Reviewed: 04/27/2016 Elsevier Patient Education  2020 [employer].

## 2019-03-21 NOTE — MAU Note (Signed)
Bronchitis flared up.  Not short of breath, just coughing and wheezing a little. Denies fever. Pt is asthmatic.  Started acting up last wk.

## 2019-03-24 ENCOUNTER — Encounter: Payer: Medicaid Other | Admitting: Obstetrics & Gynecology

## 2019-03-27 ENCOUNTER — Other Ambulatory Visit: Payer: Self-pay

## 2019-03-27 ENCOUNTER — Ambulatory Visit: Payer: Medicaid Other | Attending: Internal Medicine

## 2019-03-27 ENCOUNTER — Ambulatory Visit (INDEPENDENT_AMBULATORY_CARE_PROVIDER_SITE_OTHER): Payer: Medicaid Other | Admitting: Obstetrics and Gynecology

## 2019-03-27 ENCOUNTER — Encounter: Payer: Self-pay | Admitting: Obstetrics and Gynecology

## 2019-03-27 VITALS — BP 129/67 | HR 97 | Wt 210.2 lb

## 2019-03-27 DIAGNOSIS — Z1389 Encounter for screening for other disorder: Secondary | ICD-10-CM

## 2019-03-27 DIAGNOSIS — Z3A19 19 weeks gestation of pregnancy: Secondary | ICD-10-CM

## 2019-03-27 DIAGNOSIS — O0990 Supervision of high risk pregnancy, unspecified, unspecified trimester: Secondary | ICD-10-CM

## 2019-03-27 DIAGNOSIS — Z331 Pregnant state, incidental: Secondary | ICD-10-CM

## 2019-03-27 DIAGNOSIS — O0992 Supervision of high risk pregnancy, unspecified, second trimester: Secondary | ICD-10-CM

## 2019-03-27 DIAGNOSIS — Z20822 Contact with and (suspected) exposure to covid-19: Secondary | ICD-10-CM

## 2019-03-27 LAB — POCT URINALYSIS DIPSTICK OB
Blood, UA: NEGATIVE
Glucose, UA: NEGATIVE
Ketones, UA: NEGATIVE
Leukocytes, UA: NEGATIVE
Nitrite, UA: NEGATIVE
POC,PROTEIN,UA: NEGATIVE

## 2019-03-27 NOTE — Progress Notes (Deleted)
Patient ID: Elizabeth Stuart, female   DOB: 04-16-1995, 23 y.o.   MRN: [id number]    [facility] PREGNANCY VISIT Patient name: CHRISANNE LOOSE MRN [id number]  Date of birth: 27-Oct-1995 Chief Complaint:   High Risk Gestation  History of Present Illness:   TYE JUAREZ is a 23 y.o. G75P0000 female at [redacted]w[redacted]d with an Estimated Date of Delivery: 08/20/19 being seen today for ongoing management of a high-risk pregnancy complicated by multiple gestation trichorionic/triamniotic pregnancy  with fetal demise of baby A&C., followed q 4 wk by MFM. The only ongoing concern is a  Marginal placental cord insertion on remaining placenta. on 10/12- Baby A had absent FCA w/ CRL c/w [redacted]w[redacted]d. On 11/9 she had u/s w/ MFM that revealed Baby C absent FCA and CRL c/w [redacted]w[redacted]d.  Today she reports no complaints. Contractions: Not present. Vag. Bleeding: None.  Movement: Present. denies leaking of fluid. + Fetal movement noted Review of Systems:   Pertinent items are noted in HPI Denies abnormal vaginal discharge w/ itching/odor/irritation, headaches, visual changes, shortness of breath, chest pain, abdominal pain, severe nausea/vomiting, or problems with urination or bowel movements unless otherwise stated above. Pertinent History Reviewed:  Reviewed past medical,surgical, social, obstetrical and family history.  Reviewed problem list, medications and allergies. Physical Assessment:   Vitals:   03/27/19 1014  BP: 129/67  Pulse: 97  Weight: 210 lb 3.2 oz (95.3 kg)  Body mass index is 37.24 kg/m.           Physical Examination:   General appearance: alert, well appearing, and in no distress  Mental status: normal mood, behavior, speech, dress, motor activity, and thought processes, affect appropriate to mood  Skin: warm & dry   Extremities: Edema: None    Cardiovascular: normal heart rate noted  Respiratory: normal respiratory effort, no distress  Abdomen: gravid, soft, non-tender  Pelvic: Cervical exam deferred          Fetal Status: Fetal Heart Rate (bpm): 146 Fundal Height: 18 cm Movement: Present    Fetal Surveillance Testing today: None   Chaperone: Arnette Norris    Results for orders placed or performed in visit on 03/27/19 (from the past 24 hour(s))  POC Urinalysis Dipstick OB   Collection Time: 03/27/19 10:11 AM  Result Value Ref Range   Color, UA     Clarity, UA     Glucose, UA Negative Negative   Bilirubin, UA     Ketones, UA neg    Spec Grav, UA     Blood, UA neg    pH, UA     POC,PROTEIN,UA Negative Negative, Trace, Small (1+), Moderate (2+), Large (3+), 4+   Urobilinogen, UA     Nitrite, UA neg    Leukocytes, UA Negative Negative   Appearance     Odor      Assessment & Plan:  1) High-risk pregnancy G1P0000 at [redacted]w[redacted]d with an Estimated Date of Delivery: 08/20/19   2) trichorionic/triamniotic pregnancy  with fetal demise of baby A&C, stable  Meds: No orders of the defined types were placed in this encounter.   Labs/procedures today: None  Treatment Plan:  Fetal growth every 4 weeks thru MFM 8 weeks PN2  Reviewed: Preterm labor symptoms and general obstetric precautions including but not limited to vaginal bleeding, contractions, leaking of fluid and fetal movement were reviewed in detail with the patient.  All questions were answered. Check bp weekly, let us know if >140/90.   Follow-up: Return in about 4 weeks (  around 04/24/2019) for U/s: anatomy.  Orders Placed This Encounter  Procedures  . POC Urinalysis Dipstick OB   By signing my name below, I, Samul Dada, attest that this documentation has been prepared under the direction and in the presence of Jonnie Kind, MD. Electronically Signed: [place]. 03/27/19. 10:44 AM.  I personally performed the services described in this documentation, which was SCRIBED in my presence. The recorded information has been reviewed and considered accurate. It has been edited as necessary during review. Jonnie Kind, MD    This note is being shared , if permitted by data processing.

## 2019-03-27 NOTE — Progress Notes (Signed)
Patient ID: [name], female   DOB: October 06, 1995, 23 y.o.   MRN: [id number]    [facility] PREGNANCY VISIT Patient name: BERYL HORNBERGER MRN [id number]  Date of birth: 1995/04/24 Chief Complaint:   High Risk Gestation  History of Present Illness:   ARIES KASA is a 23 y.o. G27P0000 female at [redacted]w[redacted]d with an Estimated Date of Delivery: 08/20/19 being seen today for ongoing management of a high-risk pregnancy complicated by multiple gestation trichorionic/triamniotic pregnancy  with fetal demise of baby A&C., followed q 4 wk by MFM. The only ongoing concern is a  Marginal placental cord insertion on remaining placenta. on 10/12- Baby A had absent FCA w/ CRL c/w [redacted]w[redacted]d. On 11/9 she had u/s w/ MFM that revealed Baby C absent FCA and CRL c/w [redacted]w[redacted]d.  Today she reports no complaints. Contractions: Not present. Vag. Bleeding: None.  Movement: Present. denies leaking of fluid. + Fetal movement noted Review of Systems:   Pertinent items are noted in HPI Denies abnormal vaginal discharge w/ itching/odor/irritation, headaches, visual changes, shortness of breath, chest pain, abdominal pain, severe nausea/vomiting, or problems with urination or bowel movements unless otherwise stated above. Pertinent History Reviewed:  Reviewed past medical,surgical, social, obstetrical and family history.  Reviewed problem list, medications and allergies. Physical Assessment:   Vitals:   03/27/19 1014  BP: 129/67  Pulse: 97  Weight: 210 lb 3.2 oz (95.3 kg)  Body mass index is 37.24 kg/m.           Physical Examination:   General appearance: alert, well appearing, and in no distress  Mental status: normal mood, behavior, speech, dress, motor activity, and thought processes, affect appropriate to mood  Skin: warm & dry   Extremities: Edema: None    Cardiovascular: normal heart rate noted  Respiratory: normal respiratory effort, no distress  Abdomen: gravid, soft, non-tender  Pelvic: Cervical exam deferred          Fetal Status: Fetal Heart Rate (bpm): 146 Fundal Height: 18 cm Movement: Present    Fetal Surveillance Testing today: None   Chaperone: Arnette Norris    Results for orders placed or performed in visit on 03/27/19 (from the past 24 hour(s))  POC Urinalysis Dipstick OB   Collection Time: 03/27/19 10:11 AM  Result Value Ref Range   Color, UA     Clarity, UA     Glucose, UA Negative Negative   Bilirubin, UA     Ketones, UA neg    Spec Grav, UA     Blood, UA neg    pH, UA     POC,PROTEIN,UA Negative Negative, Trace, Small (1+), Moderate (2+), Large (3+), 4+   Urobilinogen, UA     Nitrite, UA neg    Leukocytes, UA Negative Negative   Appearance     Odor      Assessment & Plan:  1) High-risk pregnancy G1P0000 at [redacted]w[redacted]d with an Estimated Date of Delivery: 08/20/19   2) trichorionic/triamniotic pregnancy  with fetal demise of baby A&C, stable  Meds: No orders of the defined types were placed in this encounter.   Labs/procedures today: None  Treatment Plan:  Fetal growth every 4 weeks thru MFM 8 weeks PN2  Reviewed: Preterm labor symptoms and general obstetric precautions including but not limited to vaginal bleeding, contractions, leaking of fluid and fetal movement were reviewed in detail with the patient.  All questions were answered. Check bp weekly, let us know if >140/90.   Follow-up: Return in about 4 weeks (  around 04/24/2019) for U/s: anatomy.  Orders Placed This Encounter  Procedures  . POC Urinalysis Dipstick OB   By signing my name below, I, Samul Dada, attest that this documentation has been prepared under the direction and in the presence of Jonnie Kind, MD. Electronically Signed: [place]. 03/27/19. 10:44 AM.  I personally performed the services described in this documentation, which was SCRIBED in my presence. The recorded information has been reviewed and considered accurate. It has been edited as necessary during review. Jonnie Kind, MD    This note is being shared , if permitted by data processing.

## 2019-03-27 NOTE — Patient Instructions (Signed)
[phone] is the phone number for Pregnancy Classes or hospital tours at [facility].   You will be referred to  [email or web address]   for more information on childbirth classes   At this site you may register for classes. You may sign up for a waiting list if classes are full. Please SIGN UP FOR THIS!.   When the waiting list becomes long, sometimes new classes can be added.  [facility] [place] Call to Register: [phone] or [phone]   or   Register Online: [email or web address] THESE CLASSES FILL UP VERY QUICKLY, SO SIGN UP AS SOON AS YOU CAN!!! Please visit [facility] pregnancy website at [email or web address]  Childbirth Classes  Option 1: Birth & Baby Series ? Series of 3 weekly classes, on the same day of the week (can choose Mon-Thurs) from 6-9pm ? Helps you and your support person prepare for childbirth ? Reviews newborn care, labor & birth, cesarean birth, pain management, and comfort techniques ? Cost: $60 per couple for insured or self-pay, $30 per couple for Medicaid  Option 2: Weekend Birth & Baby ? This class is a weekend version of our Birth & Baby series.  It is designed for parents who have a difficult time fitting several weeks of classes into their schedule.   ? Covers the care of your newborn and the basics of labor and childbirth ? Friday 6:30pm-8:30pm Saturday 9am-4pm, includes lunch for you and your partner  ? Cost: $75 per couple for insured or self-pay, $30 per couple for Medicaid  Option 3: Natural Childbirth ? This series of 5 weekly classes is for expectant parents who want to learn and practice natural methods of coping with the process of labor and childbirth.  Can choose Mon or Tues, 7-9pm.   ? Covers relaxation, breathing, massage, visualization, role of the partner, and helpful positioning ? Participants learn how to be confident  in their body's ability to give birth. Class empowers and helps parents make informed decisions about care. Includes discussion that will help new parents transition into the immediate postpartum period.  ? Cost: $75 per couple for insured or self-pay, $30 per couple for Medicaid  Option 4: Online Birth & Baby ? This online class offers you the freedom to complete a Birth & Baby series in the comfort of your own home.  The flexibility of this option allows you to review sections at your own pace, at times convenient to you and your support people.  It includes additional video information, animations, quizzes and extended activities. Get organized with helpful eClass tools, checklists, and trackers.  ? Cost: $60 for 60 days of online access                                                                            Other Available Classes  Baby & Me Enjoy this time to discuss newborn & infant parenting topics and family adjustment issues with other new mothers in a relaxed environment. Each week brings a new speaker or baby-centered activity. We encourage mothers and their babies (birth to crawling) to join us. You are welcome to visit this group even if you haven't delivered yet! It's wonderful to make new friends early   and watch other moms interact with their babies. No registration or fee.  Big Brother/Big Sister Let your children share in the joy of a new brother or sister in this special class designed just for them. Discussion includes how families care for babies: swaddling, holding, diapering, safety, as well as how they can be helpful in their new role. This class is designed for children ages 2 to 6, but any age is welcome. Please register each child individually. $5 Breastfeeding Support Group This group is a mother-to-mother support circle where moms have the opportunity to share their breastfeeding experiences. A Breastfeeding Support nurse is present for questions and concerns. An infant  scale is available for weight checks. No fee or registration.  Breastfeeding Your Baby Breastfeeding is a special time for mother and child. This class will help you feel ready to begin this important relationship. Your partner is encouraged to attend with you. Learn what to expect and feel more confident in the first days of breastfeeding your newborn. This class also addresses the most common fears and challenges of breastfeeding during the first few weeks, months, and beyond. $30 per couple Caring for Baby This class is for expectant and adoptive parents who want to learn and practice the most up-to-date newborn care for their babies. Focus is on birth through first six weeks of life. Topics include feeding, bathing, diapering, crying, umbilical cord care, circumcision care and safe sleep. Parents learn how to recognize symptoms of illness and when to call the pediatrician. Register only the mom-to-be and your partner can plan to come with you. (*Note: This class is included in the Birth & Baby series and the Weekend Birth & Baby classes.) $10 per couple Comfort Techniques & Tour This 2-hour interactive class is designed for those who either do not wish to take the Birth & Baby series or for those who prefer our online childbirth class, but don't want to miss the opportunity to learn and practice hands-on techniques. These skills can help relieve some of the discomfort of labor and encourage your baby to rotate toward the best position for birth. You and your partner will be able to try a variety of labor positions with birth balls and rebozos as well as practice breathing, relaxation, and visual techniques. $20 per couple Daddy Boot Camp This course offers Dads-to-be the tools and knowledge needed to feel confident on their journey to becoming new fathers. Experienced dads, who have been trained as coaches, teach dads-to-be how to hold, comfort, diapers, swaddle and play with their infant while being  able to support the new mom as well. $25 Grandparent Love Expecting a grandbaby? Learn about the latest infant care and safety recommendations and ways to support your own child as he or she transitions into the parenting role. $10 per person Infant and Child CPR Parents, grandparents, babysitters, and friends learn Cardio-Pulmonary Resuscitation skills for infants and children. You will also learn how to treat both conscious and unconscious choking infants and children. Register each participant individually. (Note: This Family & Friends program does not offer certification.) $20 per person Marvelous Multiples Expecting twins, triplets, or more? This free 2-hour class covers the differences in labor, birth, parenting, and breastfeeding issues that face multiples' parents.  [facility]aternity [facility] Center Virtual Tour  Online virtual tour of the new [facility] [place]  Mom Talk This free mom-led group offers support and connection to mothers as they journey through the adjustments and struggles of that   sometimes overwhelming first year after the birth of a child. A member of our staff will be present to share resources and additional support if needed, as you care for yourself and baby. You are welcome to visit this group before you deliver! It's wonderful to meet new friends early and watch other moms interact with their babies.  Waterbirth Class Interested in a waterbirth? This free informational class will help you discover whether waterbirth is the right fit for you and is required if you are planning a waterbirth. Education about waterbirth itself, supplies you may need, and what you may need from your support team is included in this class. Partners are encouraged to come.    

## 2019-03-27 NOTE — Progress Notes (Deleted)
Patient ID: Elizabeth Stuart, female   DOB: 04-16-1995, 23 y.o.   MRN: [id number]    [facility] PREGNANCY VISIT Patient name: Elizabeth Stuart MRN [id number]  Date of birth: 27-Oct-1995 Chief Complaint:   High Risk Gestation  History of Present Illness:   Elizabeth Stuart is a 23 y.o. G75P0000 female at [redacted]w[redacted]d with an Estimated Date of Delivery: 08/20/19 being seen today for ongoing management of a high-risk pregnancy complicated by multiple gestation trichorionic/triamniotic pregnancy  with fetal demise of baby A&C., followed q 4 wk by MFM. The only ongoing concern is a  Marginal placental cord insertion on remaining placenta. on 10/12- Baby A had absent FCA w/ CRL c/w [redacted]w[redacted]d. On 11/9 she had u/s w/ MFM that revealed Baby C absent FCA and CRL c/w [redacted]w[redacted]d.  Today she reports no complaints. Contractions: Not present. Vag. Bleeding: None.  Movement: Present. denies leaking of fluid. + Fetal movement noted Review of Systems:   Pertinent items are noted in HPI Denies abnormal vaginal discharge w/ itching/odor/irritation, headaches, visual changes, shortness of breath, chest pain, abdominal pain, severe nausea/vomiting, or problems with urination or bowel movements unless otherwise stated above. Pertinent History Reviewed:  Reviewed past medical,surgical, social, obstetrical and family history.  Reviewed problem list, medications and allergies. Physical Assessment:   Vitals:   03/27/19 1014  BP: 129/67  Pulse: 97  Weight: 210 lb 3.2 oz (95.3 kg)  Body mass index is 37.24 kg/m.           Physical Examination:   General appearance: alert, well appearing, and in no distress  Mental status: normal mood, behavior, speech, dress, motor activity, and thought processes, affect appropriate to mood  Skin: warm & dry   Extremities: Edema: None    Cardiovascular: normal heart rate noted  Respiratory: normal respiratory effort, no distress  Abdomen: gravid, soft, non-tender  Pelvic: Cervical exam deferred          Fetal Status: Fetal Heart Rate (bpm): 146 Fundal Height: 18 cm Movement: Present    Fetal Surveillance Testing today: None   Chaperone: Arnette Norris    Results for orders placed or performed in visit on 03/27/19 (from the past 24 hour(s))  POC Urinalysis Dipstick OB   Collection Time: 03/27/19 10:11 AM  Result Value Ref Range   Color, UA     Clarity, UA     Glucose, UA Negative Negative   Bilirubin, UA     Ketones, UA neg    Spec Grav, UA     Blood, UA neg    pH, UA     POC,PROTEIN,UA Negative Negative, Trace, Small (1+), Moderate (2+), Large (3+), 4+   Urobilinogen, UA     Nitrite, UA neg    Leukocytes, UA Negative Negative   Appearance     Odor      Assessment & Plan:  1) High-risk pregnancy G1P0000 at [redacted]w[redacted]d with an Estimated Date of Delivery: 08/20/19   2) trichorionic/triamniotic pregnancy  with fetal demise of baby A&C, stable  Meds: No orders of the defined types were placed in this encounter.   Labs/procedures today: None  Treatment Plan:  Fetal growth every 4 weeks thru MFM 8 weeks PN2  Reviewed: Preterm labor symptoms and general obstetric precautions including but not limited to vaginal bleeding, contractions, leaking of fluid and fetal movement were reviewed in detail with the patient.  All questions were answered. Check bp weekly, let us know if >140/90.   Follow-up: Return in about 4 weeks (  around 04/24/2019) for U/s: anatomy.  Orders Placed This Encounter  Procedures  . POC Urinalysis Dipstick OB   By signing my name below, I, Samul Dada, attest that this documentation has been prepared under the direction and in the presence of Jonnie Kind, MD. Electronically Signed: [place]. 03/27/19. 10:44 AM.  I personally performed the services described in this documentation, which was SCRIBED in my presence. The recorded information has been reviewed and considered accurate. It has been edited as necessary during review. Jonnie Kind, MD

## 2019-03-27 NOTE — Progress Notes (Deleted)
Patient ID: Elizabeth Stuart, female   DOB: [birth year], 23 y.o.   MRN: [id number]    [facility] PREGNANCY VISIT Patient name: Elizabeth Stuart MRN [id number]  Date of birth: Jan 20, 1996 Chief Complaint:   No chief complaint on file.  History of Present Illness:   Elizabeth Stuart is a 23 y.o. G1P0000 female at [redacted]w[redacted]d with an Estimated Date of Delivery: 08/20/19 being seen today for ongoing management of a high-risk pregnancy complicated by multiple gestation trichorionic/triamniotic pregnancy  with fetal demise of baby A&C., followed q 4 wk by MFM. The only ongoing concern is a  Marginal placental cord insertion on remaining placenta. on 10/12- Baby A had absent FCA w/ CRL c/w [redacted]w[redacted]d. On 11/9 she had u/s w/ MFM that revealed Baby C absent FCA and CRL c/w [redacted]w[redacted]d.  Today she reports no complaints.  .  .   . denies leaking of fluid. + Fetal movement noted Review of Systems:   Pertinent items are noted in HPI Denies abnormal vaginal discharge w/ itching/odor/irritation, headaches, visual changes, shortness of breath, chest pain, abdominal pain, severe nausea/vomiting, or problems with urination or bowel movements unless otherwise stated above. Pertinent History Reviewed:  Reviewed past medical,surgical, social, obstetrical and family history.  Reviewed problem list, medications and allergies. Physical Assessment:  There were no vitals filed for this visit.There is no height or weight on file to calculate BMI.           Physical Examination:   General appearance: alert, well appearing, and in no distress  Mental status: normal mood, behavior, speech, dress, motor activity, and thought processes, affect appropriate to mood  Skin: warm & dry   Extremities:      Cardiovascular: normal heart rate noted  Respiratory: normal respiratory effort, no distress  Abdomen: gravid, soft, non-tender  Pelvic: Cervical exam deferred         Fetal Status:          Fetal Surveillance Testing today: None   Chaperone: Samul Dada    No results found for this or any previous visit (from the past 24 hour(s)).  Assessment & Plan:  1) High-risk pregnancy G1P0000 at [redacted]w[redacted]d with an Estimated Date of Delivery: 08/20/19   2) trichorionic/triamniotic pregnancy  with fetal demise of baby A&C, stable  Meds: No orders of the defined types were placed in this encounter.   Labs/procedures today: None  Treatment Plan:  Fetal growth every 4 weeks thru MFM 8 weeks PN2  Reviewed: Preterm labor symptoms and general obstetric precautions including but not limited to vaginal bleeding, contractions, leaking of fluid and fetal movement were reviewed in detail with the patient.  All questions were answered. Check bp weekly, let us know if >140/90.   Follow-up: No follow-ups on file.  No orders of the defined types were placed in this encounter.  By signing my name below, I, Samul Dada, attest that this documentation has been prepared under the direction and in the presence of Jonnie Kind, MD. Electronically Signed: [place]. 03/27/19. 10:01 AM.  I personally performed the services described in this documentation, which was SCRIBED in my presence. The recorded information has been reviewed and considered accurate. It has been edited as necessary during review. Jonnie Kind, MD

## 2019-03-28 LAB — NOVEL CORONAVIRUS, NAA: SARS-CoV-2, NAA: NOT DETECTED

## 2019-04-07 NOTE — L&D Delivery Note (Signed)
OB/[facility]N Faculty [facility] Delivery Note  Elizabeth Stuart is a 24 y.o. G1P0001 s/p NSVD at [redacted]w[redacted]d. She was admitted for IOL for FGR 5% after new finding of intermittent absent end diastolic flow on prenatal UA dopplers. Pregnancy also notable for spontaneous tri/tri triplet pregnancy with demise of fetus A and C during first trimester.  ROM: 7h 38m with clear fluid GBS Status: unknown   Maximum Maternal Temperature: 99.0 F   Labor Progress: . Patient arrived at 1 cm dilation and was induced with foley bulb, pitocin, and AROM. She then progressed to complete.   Delivery Date/Time: 07/19/2019 at 1353 Delivery: Called to room and patient was complete and pushing. Head delivered in OA position. No nuchal cord present. Shoulder and body delivered in usual fashion. Infant with spontaneous cry, placed on mother's abdomen, dried and stimulated. Cord clamped x 2 after 1-minute delay, and cut by patient's mother. Cord blood drawn. Placenta delivered spontaneously with gentle cord traction. Fundus firm with massage and Pitocin. Labia, perineum, vagina, and cervix inspected with 1st degree perineal laceration repaired in usual fashion with 3-0 Vicryl rapide.   Placenta: 3v intact, with portion of resorbed gestation within membranes, sent to pathology Complications: none Lacerations: 1st degree perineal laceration repaired in usual fashion with 3-0 Vicryl rapide EBL: 200 cc Analgesia: epidural   Infant: APGAR (1 MIN): 8   APGAR (5 MINS): 9    Weight: 2040 grams  Zack Seal, MD/[facility] OB/GYN Fellow, Faculty Practice

## 2019-04-10 ENCOUNTER — Other Ambulatory Visit: Payer: Self-pay | Admitting: Advanced Practice Midwife

## 2019-04-10 MED ORDER — ALBUTEROL SULFATE HFA 108 (90 BASE) MCG/ACT IN AERS: 2.0000 | INHALATION_SPRAY | Freq: Four times a day (QID) | RESPIRATORY_TRACT | 99 refills | Status: DC | PRN

## 2019-04-12 ENCOUNTER — Other Ambulatory Visit: Payer: Self-pay

## 2019-04-12 ENCOUNTER — Emergency Department (HOSPITAL_COMMUNITY)
Admission: EM | Admit: 2019-04-12 | Discharge: 2019-04-12 | Disposition: A | Discharge status: Home / Self Care | Payer: Medicaid Other | Attending: Emergency Medicine | Admitting: Emergency Medicine

## 2019-04-12 ENCOUNTER — Encounter (HOSPITAL_COMMUNITY): Payer: Self-pay | Admitting: Emergency Medicine

## 2019-04-12 DIAGNOSIS — Z79899 Other long term (current) drug therapy: Secondary | ICD-10-CM | POA: Diagnosis not present

## 2019-04-12 DIAGNOSIS — J45909 Unspecified asthma, uncomplicated: Secondary | ICD-10-CM | POA: Diagnosis not present

## 2019-04-12 DIAGNOSIS — Z3A00 Weeks of gestation of pregnancy not specified: Secondary | ICD-10-CM | POA: Insufficient documentation

## 2019-04-12 DIAGNOSIS — H6981 Other specified disorders of Eustachian tube, right ear: Secondary | ICD-10-CM | POA: Diagnosis not present

## 2019-04-12 DIAGNOSIS — R0981 Nasal congestion: Secondary | ICD-10-CM | POA: Insufficient documentation

## 2019-04-12 DIAGNOSIS — Z87891 Personal history of nicotine dependence: Secondary | ICD-10-CM | POA: Insufficient documentation

## 2019-04-12 DIAGNOSIS — Z91010 Allergy to peanuts: Secondary | ICD-10-CM | POA: Insufficient documentation

## 2019-04-12 DIAGNOSIS — O99891 Other specified diseases and conditions complicating pregnancy: Secondary | ICD-10-CM | POA: Insufficient documentation

## 2019-04-12 NOTE — ED Provider Notes (Signed)
[facility] EMERGENCY DEPARTMENT Provider Note   CSN: [id number] Arrival date & time: 04/12/19  0263     History Chief Complaint  Patient presents with  . Otalgia    Elizabeth Stuart is a 24 y.o. female with a history of asthma, allergy and who is currently 5 months pregnant under the care of Dr. Emelda Fear presenting with right ear pressure which is been present for about 1 week.  She describes having chronic nasal congestion with some clear rhinorrhea since early in her pregnancy.  Over the past week she has decreased hearing acuity and pressure in the right side only.  She denies drainage from the ear, denies fevers or chills, dizziness, no pain.  She was taking Claritin at Dr. Rayna Sexton recommendation initially for the congestion which was not helpful.  She has purchased Flonase but has not started this medication yet.  The history is provided by the patient.       Past Medical History:  Diagnosis Date  . Allergy   . Asthma   . Infection    Uti    Patient Active Problem List   Diagnosis Date Noted  . Supervision of high risk pregnancy, antepartum 02/17/2019  . Multiple gestation 02/17/2019  . Positive urine drug screen 01/16/2019  . Mild intermittent asthma 02/11/2015  . Allergic rhinitis due to pollen 02/11/2015  . Oral allergy syndrome 02/11/2015  . Dyspnea 12/24/2012  . Hypoxemia 12/24/2012  . Caregiver smokes indoors and in family car 12/24/2012  . Mild intermittent asthma with acute exacerbation in pediatric patient 12/24/2012    Past Surgical History:  Procedure Laterality Date  . WISDOM TOOTH EXTRACTION       OB History    Gravida  1   Para  0   Term  0   Preterm  0   AB  0   Living  0     SAB  0   TAB  0   Ectopic  0   Multiple  0   Live Births              Family History  Problem Relation Age of Onset  . Diabetes Sister   . Diabetes Brother   . Asthma Brother   . Healthy Mother   . Cancer Father        colon, lung  .  Hypertension Maternal Grandmother   . Diabetes Maternal Grandmother   . Arthritis Maternal Grandmother   . Hypertension Maternal Grandfather     Social History   Tobacco Use  . Smoking status: Former Smoker    Types: Cigars    Quit date: 01/06/2015    Years since quitting: 4.2  . Smokeless tobacco: Never Used  Substance Use Topics  . Alcohol use: No    Alcohol/week: 0.0 standard drinks  . Drug use: Not Currently    Types: Marijuana    Home Medications Prior to Admission medications   Medication Sig Start Date End Date Taking? Authorizing Provider  albuterol (VENTOLIN HFA) 108 (90 Base) MCG/ACT inhaler Inhale 2 puffs into the lungs every 6 (six) hours as needed for wheezing or shortness of breath. 04/10/19   Cresenzo-Dishmon, Scarlette Calico, CNM  ALBUTEROL IN Inhale into the lungs as needed.    [provider]  Blood Pressure Monitor MISC For regular home bp monitoring during pregnancy 02/17/19   Cheral Marker, CNM  loratadine (CLARITIN) 10 MG tablet Take 10 mg by mouth at bedtime.    [provider]  Prenatal Vit-Fe Fumarate-FA (PRENATAL VITAMIN PO) Take 1 tablet by mouth daily.     [provider]  Norethindrone-Ethinyl Estradiol-Fe Biphas (LO LOESTRIN FE) 1 MG-10 MCG / 10 MCG tablet Take 1 tablet by mouth daily. Take 1 daily by mouth 01/07/15 05/01/15  Estill Dooms, NP    Allergies    Dust mite extract, Other, Peanuts [peanut oil], and Tomato  Review of Systems   Review of Systems  Constitutional: Negative for chills and fever.  HENT: Positive for congestion, hearing loss and rhinorrhea. Negative for ear pain, sinus pressure, sore throat, trouble swallowing and voice change.        Decreased hearing acuity right per HPI  Eyes: Negative for discharge.  Respiratory: Negative for cough, shortness of breath, wheezing and stridor.   Cardiovascular: Negative for chest pain.  Gastrointestinal: Negative for abdominal pain.  Genitourinary: Negative.      Physical Exam Updated Vital Signs BP 127/70 (BP Location: Right Arm)   Pulse 87   Temp 98.1 F (36.7 C) (Oral)   Resp 12   Ht 5\' 2"  (1.575 m)   Wt 97.5 kg   LMP 11/13/2018 (Approximate)   SpO2 99%   BMI 39.32 kg/m   Physical Exam Constitutional:      Appearance: She is well-developed.  HENT:     Head: Normocephalic and atraumatic.     Right Ear: Ear canal normal.     Left Ear: Tympanic membrane and ear canal normal.     Ears:     Comments: Right TM with loss of landmarks, othewise shiny and clear.  No effusion. External canal clear. No mastoid tenderness.    Nose: Mucosal edema, congestion and rhinorrhea present.     Mouth/Throat:     Pharynx: Uvula midline. No oropharyngeal exudate or posterior oropharyngeal erythema.     Tonsils: No tonsillar abscesses.  Eyes:     Conjunctiva/sclera: Conjunctivae normal.  Cardiovascular:     Rate and Rhythm: Normal rate.     Heart sounds: Normal heart sounds.  Pulmonary:     Effort: Pulmonary effort is normal. No respiratory distress.     Breath sounds: No wheezing or rales.  Abdominal:     Palpations: Abdomen is soft.     Tenderness: There is no abdominal tenderness.  Musculoskeletal:        General: Normal range of motion.  Skin:    General: Skin is warm and dry.     Findings: No rash.  Neurological:     Mental Status: She is alert and oriented to person, place, and time.     ED Results / Procedures / Treatments   Labs (all labs ordered are listed, but only abnormal results are displayed) Labs Reviewed - No data to display  EKG None  Radiology No results found.  Procedures Procedures (including critical care time)  Medications Ordered in ED Medications - No data to display  ED Course  I have reviewed the triage vital signs and the nursing notes.  Pertinent labs & imaging results that were available during my care of the patient were reviewed by me and considered in my medical decision making (see chart for  details).    MDM Rules/Calculators/A&P                      Attempts at yawning unsuccessful in equalizing middle ear pressure. No infection present. No dizziness, no mastoid tenderness.  Encouraged to try the flonase as this may be helpful.  If not, may try a vicks vapor stick for decongestant purposes.  Also, humidifier/steam tx.  Prn f/u with pcp if sx persist or worsen.  Final Clinical Impression(s) / ED Diagnoses Final diagnoses:  Dysfunction of right eustachian tube    Rx / DC Orders ED Discharge Orders    None       Victoriano Lain 04/12/19 2141    Shon Baton, MD 04/17/19 [phone]

## 2019-04-12 NOTE — Discharge Instructions (Addendum)
You may try the flonase as you are planning to help with your congestion, adding a vicks vapor inhaler stick if the flonase is not helpful.  Warm moist air (humidifer, steamy bathroom) can also help relieve your symptoms.  There is no sign of an ear infection today.

## 2019-04-12 NOTE — ED Triage Notes (Signed)
Pt states that she has had an earache for about a week on the right side

## 2019-04-17 ENCOUNTER — Other Ambulatory Visit (HOSPITAL_COMMUNITY): Payer: Self-pay | Admitting: *Deleted

## 2019-04-17 ENCOUNTER — Other Ambulatory Visit: Payer: Self-pay

## 2019-04-17 ENCOUNTER — Encounter (HOSPITAL_COMMUNITY): Payer: Self-pay | Admitting: *Deleted

## 2019-04-17 ENCOUNTER — Ambulatory Visit (HOSPITAL_COMMUNITY): Payer: Medicaid Other | Admitting: *Deleted

## 2019-04-17 ENCOUNTER — Ambulatory Visit (HOSPITAL_COMMUNITY)
Admission: RE | Admit: 2019-04-17 | Discharge: 2019-04-17 | Disposition: A | Discharge status: Home / Self Care | Payer: Medicaid Other | Source: Ambulatory Visit | Attending: Obstetrics | Admitting: Obstetrics

## 2019-04-17 DIAGNOSIS — O021 Missed abortion: Secondary | ICD-10-CM

## 2019-04-17 DIAGNOSIS — Z362 Encounter for other antenatal screening follow-up: Secondary | ICD-10-CM

## 2019-04-17 DIAGNOSIS — O30102 Triplet pregnancy, unspecified number of placenta and unspecified number of amniotic sacs, second trimester: Secondary | ICD-10-CM

## 2019-04-17 DIAGNOSIS — Z3A22 22 weeks gestation of pregnancy: Secondary | ICD-10-CM

## 2019-04-17 DIAGNOSIS — O0990 Supervision of high risk pregnancy, unspecified, unspecified trimester: Secondary | ICD-10-CM | POA: Insufficient documentation

## 2019-04-21 ENCOUNTER — Encounter: Payer: Self-pay | Admitting: Obstetrics & Gynecology

## 2019-04-21 ENCOUNTER — Encounter: Payer: Self-pay | Admitting: *Deleted

## 2019-04-24 ENCOUNTER — Other Ambulatory Visit: Payer: Medicaid Other

## 2019-04-24 ENCOUNTER — Other Ambulatory Visit: Payer: Self-pay

## 2019-04-24 ENCOUNTER — Ambulatory Visit (INDEPENDENT_AMBULATORY_CARE_PROVIDER_SITE_OTHER): Payer: Medicaid Other | Admitting: Advanced Practice Midwife

## 2019-04-24 VITALS — BP 128/71 | HR 93 | Wt 222.0 lb

## 2019-04-24 DIAGNOSIS — O99342 Other mental disorders complicating pregnancy, second trimester: Secondary | ICD-10-CM

## 2019-04-24 DIAGNOSIS — Z3A23 23 weeks gestation of pregnancy: Secondary | ICD-10-CM

## 2019-04-24 DIAGNOSIS — Z331 Pregnant state, incidental: Secondary | ICD-10-CM

## 2019-04-24 DIAGNOSIS — F4321 Adjustment disorder with depressed mood: Secondary | ICD-10-CM

## 2019-04-24 DIAGNOSIS — Z1389 Encounter for screening for other disorder: Secondary | ICD-10-CM

## 2019-04-24 DIAGNOSIS — O0992 Supervision of high risk pregnancy, unspecified, second trimester: Secondary | ICD-10-CM

## 2019-04-24 DIAGNOSIS — O0990 Supervision of high risk pregnancy, unspecified, unspecified trimester: Secondary | ICD-10-CM

## 2019-04-24 LAB — POCT URINALYSIS DIPSTICK OB
Blood, UA: NEGATIVE
Glucose, UA: NEGATIVE
Ketones, UA: NEGATIVE
Leukocytes, UA: NEGATIVE
Nitrite, UA: NEGATIVE
POC,PROTEIN,UA: NEGATIVE

## 2019-04-24 NOTE — Patient Instructions (Signed)
Elizabeth Stuart, I greatly value your feedback.  If you receive a survey following your visit with [place] today, we appreciate you taking the time to fill it out.  Thanks, Philipp Deputy, CNM  [facility] HAS MOVED!!! It is now [facility] at [facility] ([address] [place], [place] [address]) Entrance located off of [address] Free 24/7 valet parking   Go to [email or web address] to register for FREE online childbirth classes  [place] Pediatricians/Family Doctors:  [facility] [phone]            [facility] [phone]                 [facility] [phone] (usually not accepting new patients unless you have family there already, you are always welcome to call and ask)       [facility] Department [phone]       [facility] Pediatricians/Family Doctors:   [facility]: [phone]  [facility]: [phone]  [facility]: [phone]  [facility] Doctors:   [facility]: [phone]   [facility]: [phone]  [facility] Doctors:  [facility]: [phone]    Home Blood Pressure Monitoring for Patients   Your provider has recommended that you check your blood pressure (BP) at least once a week at home. If you do not have a blood pressure cuff at home, one will be provided for you. Contact your provider if you have not received your monitor within 1 week.   Helpful Tips for Accurate Home Blood Pressure Checks  . Don't smoke, exercise, or drink caffeine 30 minutes before checking your BP . Use the restroom before checking your BP (a full bladder can raise your pressure) . Relax in a comfortable upright chair . Feet on the ground . Left arm resting comfortably on a flat surface at the level of your heart . Legs uncrossed . Back supported . Sit quietly and don't talk . Place the cuff on your bare  arm . Adjust snuggly, so that only two fingertips can fit between your skin and the top of the cuff . Check 2 readings separated by at least one minute . Keep a log of your BP readings . For a visual, please reference this diagram: http://ccnc.care/bpdiagram  Provider Name: [facility]     Phone: [phone]  Zone 1: ALL CLEAR  Continue to monitor your symptoms:  . BP reading is less than 140 (top number) or less than 90 (bottom number)  . No right upper stomach pain . No headaches or seeing spots . No feeling nauseated or throwing up . No swelling in face and hands  Zone 2: CAUTION Call your doctor's office for any of the following:  . BP reading is greater than 140 (top number) or greater than 90 (bottom number)  . Stomach pain under your ribs in the middle or right side . Headaches or seeing spots . Feeling nauseated or throwing up . Swelling in face and hands  Zone 3: EMERGENCY  Seek immediate medical care if you have any of the following:  . BP reading is greater than160 (top number) or greater than 110 (bottom number) . Severe headaches not improving with Tylenol . Serious difficulty catching your breath . Any worsening symptoms from Zone 2     Second Trimester of Pregnancy The second trimester is from week 14 through week 27 (months 4 through 6). The second trimester is often  a time when you feel your best. Your body has adjusted to being pregnant, and you begin to feel better physically. Usually, morning sickness has lessened or quit completely, you may have more energy, and you may have an increase in appetite. The second trimester is also a time when the fetus is growing rapidly. At the end of the sixth month, the fetus is about 9 inches long and weighs about 1 pounds. You will likely begin to feel the baby move (quickening) between 16 and 20 weeks of pregnancy. Body changes during your second trimester Your body continues to go through many changes during your  second trimester. The changes vary from woman to woman.  Your weight will continue to increase. You will notice your lower abdomen bulging out.  You may begin to get stretch marks on your hips, abdomen, and breasts.  You may develop headaches that can be relieved by medicines. The medicines should be approved by your health care provider.  You may urinate more often because the fetus is pressing on your bladder.  You may develop or continue to have heartburn as a result of your pregnancy.  You may develop constipation because certain hormones are causing the muscles that push waste through your intestines to slow down.  You may develop hemorrhoids or swollen, bulging veins (varicose veins).  You may have back pain. This is caused by: ? Weight gain. ? Pregnancy hormones that are relaxing the joints in your pelvis. ? A shift in weight and the muscles that support your balance.  Your breasts will continue to grow and they will continue to become tender.  Your gums may bleed and may be sensitive to brushing and flossing.  Dark spots or blotches (chloasma, mask of pregnancy) may develop on your face. This will likely fade after the baby is born.  A dark line from your belly button to the pubic area (linea nigra) may appear. This will likely fade after the baby is born.  You may have changes in your hair. These can include thickening of your hair, rapid growth, and changes in texture. Some women also have hair loss during or after pregnancy, or hair that feels dry or thin. Your hair will most likely return to normal after your baby is born.  What to expect at prenatal visits During a routine prenatal visit:  You will be weighed to make sure you and the fetus are growing normally.  Your blood pressure will be taken.  Your abdomen will be measured to track your baby's growth.  The fetal heartbeat will be listened to.  Any test results from the previous visit will be  discussed.  Your health care provider may ask you:  How you are feeling.  If you are feeling the baby move.  If you have had any abnormal symptoms, such as leaking fluid, bleeding, severe headaches, or abdominal cramping.  If you are using any tobacco products, including cigarettes, chewing tobacco, and electronic cigarettes.  If you have any questions.  Other tests that may be performed during your second trimester include:  Blood tests that check for: ? Low iron levels (anemia). ? High blood sugar that affects pregnant women (gestational diabetes) between 16 and 28 weeks. ? Rh antibodies. This is to check for a protein on red blood cells (Rh factor).  Urine tests to check for infections, diabetes, or protein in the urine.  An ultrasound to confirm the proper growth and development of the baby.  An amniocentesis to check  for possible genetic problems.  Fetal screens for spina bifida and Down syndrome.  HIV (human immunodeficiency virus) testing. Routine prenatal testing includes screening for HIV, unless you choose not to have this test.  Follow these instructions at home: Medicines  Follow your health care provider's instructions regarding medicine use. Specific medicines may be either safe or unsafe to take during pregnancy.  Take a prenatal vitamin that contains at least 600 micrograms (mcg) of folic acid.  If you develop constipation, try taking a stool softener if your health care provider approves. Eating and drinking  Eat a balanced diet that includes fresh fruits and vegetables, whole grains, good sources of protein such as meat, eggs, or tofu, and low-fat dairy. Your health care provider will help you determine the amount of weight gain that is right for you.  Avoid raw meat and uncooked cheese. These carry germs that can cause birth defects in the baby.  If you have low calcium intake from food, talk to your health care provider about whether you should take a  daily calcium supplement.  Limit foods that are high in fat and processed sugars, such as fried and sweet foods.  To prevent constipation: ? Drink enough fluid to keep your urine clear or pale yellow. ? Eat foods that are high in fiber, such as fresh fruits and vegetables, whole grains, and beans. Activity  Exercise only as directed by your health care provider. Most women can continue their usual exercise routine during pregnancy. Try to exercise for 30 minutes at least 5 days a week. Stop exercising if you experience uterine contractions.  Avoid heavy lifting, wear low heel shoes, and practice good posture.  A sexual relationship may be continued unless your health care provider directs you otherwise. Relieving pain and discomfort  Wear a good support bra to prevent discomfort from breast tenderness.  Take warm sitz baths to soothe any pain or discomfort caused by hemorrhoids. Use hemorrhoid cream if your health care provider approves.  Rest with your legs elevated if you have leg cramps or low back pain.  If you develop varicose veins, wear support hose. Elevate your feet for 15 minutes, 3-4 times a day. Limit salt in your diet. Prenatal Care  Write down your questions. Take them to your prenatal visits.  Keep all your prenatal visits as told by your health care provider. This is important. Safety  Wear your seat belt at all times when driving.  Make a list of emergency phone numbers, including numbers for family, friends, the hospital, and police and fire departments. General instructions  Ask your health care provider for a referral to a local prenatal education class. Begin classes no later than the beginning of month 6 of your pregnancy.  Ask for help if you have counseling or nutritional needs during pregnancy. Your health care provider can offer advice or refer you to specialists for help with various needs.  Do not use hot tubs, steam rooms, or saunas.  Do not  douche or use tampons or scented sanitary pads.  Do not cross your legs for long periods of time.  Avoid cat litter boxes and soil used by cats. These carry germs that can cause birth defects in the baby and possibly loss of the fetus by miscarriage or stillbirth.  Avoid all smoking, herbs, alcohol, and unprescribed drugs. Chemicals in these products can affect the formation and growth of the baby.  Do not use any products that contain nicotine or tobacco, such as cigarettes  and e-cigarettes. If you need help quitting, ask your health care provider.  Visit your dentist if you have not gone yet during your pregnancy. Use a soft toothbrush to brush your teeth and be gentle when you floss. Contact a health care provider if:  You have dizziness.  You have mild pelvic cramps, pelvic pressure, or nagging pain in the abdominal area.  You have persistent nausea, vomiting, or diarrhea.  You have a bad smelling vaginal discharge.  You have pain when you urinate. Get help right away if:  You have a fever.  You are leaking fluid from your vagina.  You have spotting or bleeding from your vagina.  You have severe abdominal cramping or pain.  You have rapid weight gain or weight loss.  You have shortness of breath with chest pain.  You notice sudden or extreme swelling of your face, hands, ankles, feet, or legs.  You have not felt your baby move in over an hour.  You have severe headaches that do not go away when you take medicine.  You have vision changes. Summary  The second trimester is from week 14 through week 27 (months 4 through 6). It is also a time when the fetus is growing rapidly.  Your body goes through many changes during pregnancy. The changes vary from woman to woman.  Avoid all smoking, herbs, alcohol, and unprescribed drugs. These chemicals affect the formation and growth your baby.  Do not use any tobacco products, such as cigarettes, chewing tobacco, and  e-cigarettes. If you need help quitting, ask your health care provider.  Contact your health care provider if you have any questions. Keep all prenatal visits as told by your health care provider. This is important. This information is not intended to replace advice given to you by your health care provider. Make sure you discuss any questions you have with your health care provider. Document Released: 03/17/2001 Document Revised: 08/29/2015 Document Reviewed: 05/24/2012 Elsevier Interactive Patient Education  2017 [place] FLU! Because you are pregnant, we at [facility], along with the Centers for Disease Control (CDC), recommend that you receive the flu vaccine to protect yourself and your baby from the flu. The flu is more likely to cause severe illness in pregnant women than in women of reproductive age who are not pregnant. Changes in the immune system, heart, and lungs during pregnancy make pregnant women (and women up to two weeks postpartum) more prone to severe illness from flu, including illness resulting in hospitalization. Flu also may be harmful for a pregnant woman's developing baby. A common flu symptom is fever, which may be associated with neural tube defects and other adverse outcomes for a developing baby. Getting vaccinated can also help protect a baby after birth from flu. (Mom passes antibodies onto the developing baby during her pregnancy.)  A Flu Vaccine is the Best Protection Against Flu Getting a flu vaccine is the first and most important step in protecting against flu. Pregnant women should get a flu shot and not the live attenuated influenza vaccine (LAIV), also known as nasal spray flu vaccine. Flu vaccines given during pregnancy help protect both the mother and her baby from flu. Vaccination has been shown to reduce the risk of flu-associated acute respiratory infection in pregnant women by up to one-half. A 2018 study showed  that getting a flu shot reduced a pregnant woman's risk of being hospitalized with flu by an average of 40  percent. Pregnant women who get a flu vaccine are also helping to protect their babies from flu illness for the first several months after their birth, when they are too young to get vaccinated.   A Long Record of Safety for Flu Shots in Pregnant Women Flu shots have been given to millions of pregnant women over many years with a good safety record. There is a lot of evidence that flu vaccines can be given safely during pregnancy; though these data are limited for the first trimester. The CDC recommends that pregnant women get vaccinated during any trimester of their pregnancy. It is very important for pregnant women to get the flu shot.   Other Preventive Actions In addition to getting a flu shot, pregnant women should take the same everyday preventive actions the CDC recommends of everyone, including covering coughs, washing hands often, and avoiding people who are sick.  Symptoms and Treatment If you get sick with flu symptoms call your doctor right away. There are antiviral drugs that can treat flu illness and prevent serious flu complications. The CDC recommends prompt treatment for people who have influenza infection or suspected influenza infection and who are at high risk of serious flu complications, such as people with asthma, diabetes (including gestational diabetes), or heart disease. Early treatment of influenza in hospitalized pregnant women has been shown to reduce the length of the hospital stay.  Symptoms Flu symptoms include fever, cough, sore throat, runny or stuffy nose, body aches, headache, chills and fatigue. Some people may also have vomiting and diarrhea. People may be infected with the flu and have respiratory symptoms without a fever.  Early Treatment is Important for Pregnant Women Treatment should begin as soon as possible because antiviral drugs work best when  started early (within 48 hours after symptoms start). Antiviral drugs can make your flu illness milder and make you feel better faster. They may also prevent serious health problems that can result from flu illness. Oral oseltamivir (Tamiflu) is the preferred treatment for pregnant women because it has the most studies available to suggest that it is safe and beneficial. Antiviral drugs require a prescription from your provider. Having a fever caused by flu infection or other infections early in pregnancy may be linked to birth defects in a baby. In addition to taking antiviral drugs, pregnant women who get a fever should treat their fever with Tylenol (acetaminophen) and contact their provider immediately.  When to [place] If you are pregnant and have any of these signs, seek care immediately:  Difficulty breathing or shortness of breath  Pain or pressure in the chest or abdomen  Sudden dizziness  Confusion  Severe or persistent vomiting  High fever that is not responding to Tylenol (or store brand equivalent)  Decreased or no movement of your baby  [email or web address]

## 2019-04-24 NOTE — Progress Notes (Signed)
LOW-RISK PREGNANCY VISIT Patient name: Elizabeth Stuart MRN [id number]  Date of birth: 07/10/1995 Chief Complaint:   Routine Prenatal Visit  History of Present Illness:   Elizabeth Stuart is a 24 y.o. G51P0000 female at [redacted]w[redacted]d with an Estimated Date of Delivery: 08/20/19 being seen today for ongoing management of a low-risk pregnancy.  Today she reports having some sinus drainage; no fever. Contractions: Not present. Vag. Bleeding: None.  Movement: Present. denies leaking of fluid.  Has some preferences for birth: would like to delay cord clamping until it stops pulsing; would like to keep placenta (initially requested [place], but informed that peds highly discourages); FOB is concerned re pt's grief processing with the demise of the other two babies- will look into an appt with Elizabeth Stuart; she feels a little sad, but feels she is doing well overall- is more concerned with how she might feel at the birth  Review of Systems:   Pertinent items are noted in HPI Denies abnormal vaginal discharge w/ itching/odor/irritation, headaches, visual changes, shortness of breath, chest pain, abdominal pain, severe nausea/vomiting, or problems with urination or bowel movements unless otherwise stated above. Pertinent History Reviewed:  Reviewed past medical,surgical, social, obstetrical and family history.  Reviewed problem list, medications and allergies. Physical Assessment:   Vitals:   04/24/19 1053  BP: 128/71  Pulse: 93  Weight: 222 lb (100.7 kg)  Body mass index is 40.6 kg/m.        Physical Examination:   General appearance: Well appearing, and in no distress  Mental status: Alert, oriented to person, place, and time  Skin: Warm & dry  Cardiovascular: Normal heart rate noted  Respiratory: Normal respiratory effort, no distress  Abdomen: Soft, gravid, nontender  Pelvic: Cervical exam deferred         Extremities: Edema: None  Fetal Status: Fetal Heart Rate (bpm): 145 Fundal Height: 23 cm Movement:  Present    Results for orders placed or performed in visit on 04/24/19 (from the past 24 hour(s))  POC Urinalysis Dipstick OB   Collection Time: 04/24/19 11:00 AM  Result Value Ref Range   Color, UA     Clarity, UA     Glucose, UA Negative Negative   Bilirubin, UA     Ketones, UA neg    Spec Grav, UA     Blood, UA neg    pH, UA     POC,PROTEIN,UA Negative Negative, Trace, Small (1+), Moderate (2+), Large (3+), 4+   Urobilinogen, UA     Nitrite, UA neg    Leukocytes, UA Negative Negative   Appearance     Odor      Assessment & Plan:  1) Low-risk pregnancy G1P0000 at [redacted]w[redacted]d with an Estimated Date of Delivery: 08/20/19   2) Sinus drainage, taking OTC meds; no fever  3) Feelings of sadness over demise of other two babies, will reach out to Elizabeth Stuart for grief assistance; doesn't necessarily feel depressed, but is more concerned for how she will feel at birth  74) Birth preferences, wants cord to stop pulsing before cutting; wants to keep placenta   Meds: No orders of the defined types were placed in this encounter.  Labs/procedures today: no  Plan:  Continue routine obstetrical care   Reviewed: Preterm labor symptoms and general obstetric precautions including but not limited to vaginal bleeding, contractions, leaking of fluid and fetal movement were reviewed in detail with the patient.  All questions were answered. Has home bp cuff.  Check bp  weekly, let us know if >140/90.   Follow-up: Return in about 4 weeks (around 05/22/2019) for LROB, in person, PN2.  Orders Placed This Encounter  Procedures  . POC Urinalysis Dipstick OB   [name] 04/24/2019 12:28 PM

## 2019-04-24 NOTE — Addendum Note (Signed)
Addended by: Hulda Marin C on: 04/24/2019 12:54 PM   Modules accepted: Orders

## 2019-05-02 ENCOUNTER — Other Ambulatory Visit: Payer: Self-pay

## 2019-05-02 ENCOUNTER — Ambulatory Visit (INDEPENDENT_AMBULATORY_CARE_PROVIDER_SITE_OTHER): Payer: Medicaid Other | Admitting: Obstetrics and Gynecology

## 2019-05-02 VITALS — BP 116/73 | HR 100 | Wt 227.6 lb

## 2019-05-02 DIAGNOSIS — Z331 Pregnant state, incidental: Secondary | ICD-10-CM

## 2019-05-02 DIAGNOSIS — Z1389 Encounter for screening for other disorder: Secondary | ICD-10-CM

## 2019-05-02 DIAGNOSIS — Z3A24 24 weeks gestation of pregnancy: Secondary | ICD-10-CM | POA: Diagnosis not present

## 2019-05-02 DIAGNOSIS — L900 Lichen sclerosus et atrophicus: Secondary | ICD-10-CM

## 2019-05-02 DIAGNOSIS — O26892 Other specified pregnancy related conditions, second trimester: Secondary | ICD-10-CM

## 2019-05-02 LAB — POCT URINALYSIS DIPSTICK OB
Blood, UA: NEGATIVE
Glucose, UA: NEGATIVE
Ketones, UA: NEGATIVE
Leukocytes, UA: NEGATIVE
Nitrite, UA: NEGATIVE
POC,PROTEIN,UA: NEGATIVE

## 2019-05-02 MED ORDER — CEPHALEXIN 500 MG PO CAPS: 500.0000 mg | ORAL_CAPSULE | Freq: Four times a day (QID) | ORAL | 0 refills | Status: DC

## 2019-05-02 NOTE — Progress Notes (Signed)
Patient ID: Elizabeth Stuart, female   DOB: 1995/07/25, 24 y.o.   MRN: [id number]    LOW-RISK PREGNANCY VISIT Patient name: Elizabeth Stuart MRN [id number]  Date of birth: 23-Jul-1995 Chief Complaint:   Routine Prenatal Visit (toenail infection)  History of Present Illness:   Elizabeth Stuart is a 24 y.o. G47P0000 female at [redacted]w[redacted]d with an Estimated Date of Delivery: 08/20/19 being seen today for ongoing management of a low-risk pregnancy.  Today she reports ingrown toenail. Contractions: Not present. Vag. Bleeding: None.  Movement: Present. denies leaking of fluid. Review of Systems:   Pertinent items are noted in HPI Denies abnormal vaginal discharge w/ itching/odor/irritation, headaches, visual changes, shortness of breath, chest pain, abdominal pain, severe nausea/vomiting, or problems with urination or bowel movements unless otherwise stated above. Pertinent History Reviewed:  Reviewed past medical,surgical, social, obstetrical and family history.  Reviewed problem list, medications and allergies. Physical Assessment:   Vitals:   05/02/19 1126  BP: 116/73  Pulse: 100  Weight: 227 lb 9.6 oz (103.2 kg)  Body mass index is 41.63 kg/m.        Physical Examination:   General appearance: Well appearing, and in no distress  Mental status: Alert, oriented to person, place, and time  Skin: Warm & dry.   Cardiovascular: Normal heart rate noted  Respiratory: Normal respiratory effort, no distress  Abdomen: Soft, gravid, nontender  Pelvic: Cervical exam deferred         Extremities: Edema: Trace   Left foot big toe: no active blood. Some white pus and purulence. 2 cc lidocaine used for dorsal black. Linear incision to drain puss from toe  Fetal Status: Fetal Heart Rate (bpm): 142   Movement: Present    Chaperone: Arnette Norris    Results for orders placed or performed in visit on 05/02/19 (from the past 24 hour(s))  POC Urinalysis Dipstick OB   Collection Time: 05/02/19 11:24 AM  Result Value Ref  Range   Color, UA     Clarity, UA     Glucose, UA Negative Negative   Bilirubin, UA     Ketones, UA n    Spec Grav, UA     Blood, UA n    pH, UA     POC,PROTEIN,UA Negative Negative, Trace, Small (1+), Moderate (2+), Large (3+), 4+   Urobilinogen, UA     Nitrite, UA n    Leukocytes, UA Negative Negative   Appearance     Odor      Assessment & Plan:  1) Low-risk pregnancy G1P0000 at [redacted]w[redacted]d with an Estimated Date of Delivery: 08/20/19   2) ingrown toenail infection, 500 mg BID x 7 days I & D under local done medial aspect of left big toe.   Meds: No orders of the defined types were placed in this encounter.  Labs/procedures today: None  Plan:  Continue routine obstetrical care  Next visit: will be in person for PN2    Follow-up: Return in about 2 weeks (around 05/16/2019) for PN2 (sugar test).  Orders Placed This Encounter  Procedures  . POC Urinalysis Dipstick OB   By signing my name below, I, Arnette Norris, attest that this documentation has been prepared under the direction and in the presence of Tilda Burrow, MD. Electronically Signed: Arnette Norris Medical Scribe. 05/02/19. 11:48 AM.  I personally performed the services described in this documentation, which was SCRIBED in my presence. The recorded information has been reviewed and considered accurate. It has been edited  as necessary during review. Jonnie Kind, MD

## 2019-05-05 NOTE — BH Specialist Note (Addendum)
Integrated Behavioral Health via Telemedicine Video Visit  05/05/2019 ANOLA MCGOUGH [id number]  Number of Integrated Behavioral Health visits: 1 Session Start time: 2:17  Session End time: 2:50 Total time: 33  Referring Provider: Cam Hai, CNM Type of Visit: Video Patient/Family location: [facility] Provider location: [facility] All persons participating in visit: Patient D[place] Ament and [facility] Jowanda Heeg    Confirmed patient's address: Yes  Confirmed patient's phone number: Yes  Any changes to demographics: No   Confirmed patient's insurance: Yes  Any changes to patient's insurance: No   Discussed confidentiality: Yes   I connected with Sumaya A Eichinger  by a video enabled telemedicine application and verified that I am speaking with the correct person using two identifiers.     I discussed the limitations of evaluation and management by telemedicine and the availability of in person appointments.  I discussed that the purpose of this visit is to provide behavioral health care while limiting exposure to the novel coronavirus.   Discussed there is a possibility of technology failure and discussed alternative modes of communication if that failure occurs.  I discussed that engaging in this video visit, they consent to the provision of behavioral healthcare and the services will be billed under their insurance.  Patient and/or legal guardian expressed understanding and consented to video visit: Yes   PRESENTING CONCERNS: Patient and/or family reports the following symptoms/concerns: Pt states her greatest concern is mild disagreements with FOB, feeling worried to leave the house to keep baby safe after loss of two of the triplets. Pt says she is feeling much better now than right after the losses, and is open to tips to prevent depression and grief during postpartum time.  Duration of problem: About 4 months; Severity of problem: mild  STRENGTHS (Protective Factors/Coping  Skills): Social support  GOALS ADDRESSED: Patient will: 1.  Increase knowledge and/or ability of: healthy habits  2.  Demonstrate ability to: Continue healthy grieving over losses  INTERVENTIONS: Interventions utilized:  Supportive Counseling, Psychoeducation and/or Health Education and Link to [employer] Standardized Assessments completed: Not Needed  ASSESSMENT: Patient currently experiencing Grief.   Patient may benefit from psychoeducation and brief therapeutic interventions regarding coping with grief .  PLAN: 1. Follow up with behavioral health clinician on : One month 2. Behavioral recommendations:  -Continue taking prenatal vitamin daily -Prioritize healthy sleep as medicine nightly -Read through Postpartum Planner on After Visit Summary -Go to [email or web address], sign up for childbirth education class of choice and watch Virtual [facility] 3. Referral(s): Integrated [employer] (In Clinic)  I discussed the assessment and treatment plan with the patient and/or parent/guardian. They were provided an opportunity to ask questions and all were answered. They agreed with the plan and demonstrated an understanding of the instructions.   They were advised to call back or seek an in-person evaluation if the symptoms worsen or if the condition fails to improve as anticipated.  Valetta Close Cedra Villalon

## 2019-05-08 ENCOUNTER — Ambulatory Visit (INDEPENDENT_AMBULATORY_CARE_PROVIDER_SITE_OTHER): Payer: Medicaid Other | Admitting: Clinical

## 2019-05-08 DIAGNOSIS — F4321 Adjustment disorder with depressed mood: Secondary | ICD-10-CM

## 2019-05-08 NOTE — Patient Instructions (Signed)
BRAINSTORMING  Develop a Plan Goals: . Provide a way to start conversation about your new life with a baby . Assist parents in recognizing and using resources within their reach . Help pave the way before birth for an easier period of transition afterwards.  Make a list of the following information to keep in a central location: . Full name of Mom and Partner: _____________________________________________ . 26 full name and Date of Birth: ___________________________________________ . Home Address: ___________________________________________________________ ________________________________________________________________________ . Home Phone: ____________________________________________________________ . Parents' cell numbers: _____________________________________________________ ________________________________________________________________________ . Name and contact info for OB: ______________________________________________ . Name and contact info for Pediatrician:________________________________________ . Contact info for Lactation Consultants: ________________________________________  REST and SLEEP *You each need at least 4-5 hours of uninterrupted sleep every day. Write specific names and contact information.* . How are you going to rest in the postpartum period? While partner's home? When partner returns to work? When you both return to work? Marland Kitchen Where will your baby sleep? Marland Kitchen Who is available to help during the day? Evening? Night? . Who could move in for a period to help support you? Marland Kitchen What are some ideas to help you get enough sleep? __________________________________________________________________________________________________________________________________________________________________________________________________________________________________________ NUTRITIOUS FOOD AND DRINK *Plan for meals before your baby is born so you can have healthy food to eat  during the immediate postpartum period.* . Who will look after breakfast? Lunch? Dinner? List names and contact information. Brainstorm quick, healthy ideas for each meal. . What can you do before baby is born to prepare meals for the postpartum period? . How can others help you with meals? Marland Kitchen Which grocery stores provide online shopping and delivery? Marland Kitchen Which restaurants offer take-out or delivery options? ______________________________________________________________________________________________________________________________________________________________________________________________________________________________________________________________________________________________________________________________________________________________________________________________________  CARE FOR MOM *It's important that mom is cared for and pampered in the postpartum period. Remember, the most important ways new mothers need care are: sleep, nutrition, gentle exercise, and time off.* . Who can come take care of mom during this period? Make a list of people with their contact information. . List some activities that make you feel cared for, rested, and energized? Who can make sure you have opportunities to do these things? . Does mom have a space of her very own within your home that's just for her? Make a "[facility]" where she can be comfortable, rest, and renew herself daily. ______________________________________________________________________________________________________________________________________________________________________________________________________________________________________________________________________________________________________________________________________________________________________________________________________    CARE FOR AND FEEDING BABY *Knowledgeable and encouraging people will offer the best support with regard to feeding your  baby.* . Educate yourself and choose the best feeding option for your baby. . Make a list of people who will guide, support, and be a resource for you as your care for and feed your baby. (Friends that have breastfed or are currently breastfeeding, lactation consultants, breastfeeding support groups, etc.) . Consider a postpartum doula. (These websites can give you information: dona.org & BuyingShow.es) . Seek out local breastfeeding resources like the breastfeeding support group at [employer] or [employer]. ______________________________________________________________________________________________________________________________________________________________________________________________________________________________________________________________________________________________________________________________________________________________________________________________________  Verner Chol AND ERRANDS . Who can help with a thorough cleaning before baby is born? . Make a list of people who will help with housekeeping and chores, like laundry, light cleaning, dishes, bathrooms, etc. . Who can run some errands for you? Marland Kitchen What can you do to make sure you are stocked with basic supplies before baby is born? . Who is going to do the shopping? ______________________________________________________________________________________________________________________________________________________________________________________________________________________________________________________________________________________________________________________________________________________________________________________________________     Family Adjustment *Nurture yourselves.it helps parents be more loving and allows for better bonding with their child.* .  What sorts of things do you and partner enjoy doing together? Which activities help you to connect and strengthen your relationship? Make a list of  those things. Make a list of people whom you trust to care for your baby so you can have some time together as a couple. . What types of things help partner feel connected to Mom? Make a list. . What needs will partner have in order to bond with baby? . Other children? Who will care for them when you go into labor and while you are in the hospital? . Think about what the needs of your older children might be. Who can help you meet those needs? In what ways are you helping them prepare for bringing baby home? List some specific strategies you have for family adjustment. _______________________________________________________________________________________________________________________________________________________________________________________________________________________________________________________________________________________________________________________________________________  SUPPORT *Someone who can empathize with experiences normalizes your problems and makes them more bearable.* . Make a list of other friends, neighbors, and/or co-workers you know with infants (and small children, if applicable) with whom you can connect. . Make a list of local or online support groups, mom groups, etc. in which you can be involved. ______________________________________________________________________________________________________________________________________________________________________________________________________________________________________________________________________________________________________________________________________________________________________________________________________  Childcare Plans . Investigate and plan for childcare if mom is returning to work. . Talk about mom's concerns about her transition back to work. . Talk about partner's concerns regarding this transition.  Mental Health *Your mental health is one of the highest priorities for  a pregnant or postpartum mom.* . 1 in 5 women experience anxiety and/or depression from the time of conception through the first year after birth. . Postpartum Mood Disorders are the #1 complication of pregnancy and childbirth and the suffering experienced by these mothers is not necessary! These illnesses are temporary and respond well to treatment, which often includes self-care, social support, talk therapy, and medication when needed. . Women experiencing anxiety and depression often say things like: "I'm supposed to be happy.why do I feel so sad?", "Why can't I snap out of it?", "I'm having thoughts that scare me." . There is no need to be embarrassed if you are feeling these symptoms: o Overwhelmed, anxious, angry, sad, guilty, irritable, hopeless, exhausted but can't sleep o You are NOT alone. You are NOT to blame. With help, you WILL be well. . Where can I find help? Medical professionals such as your OB, midwife, gynecologist, family practitioner, primary care provider, pediatrician, or mental health providers; [facility] support groups: Feelings After Birth, Breastfeeding Support Group, Baby and Me Group, and Fit 4 Two exercise classes. . You have permission to ask for help. It will confirm your feelings, validate your experiences, share/learn coping strategies, and gain support and encouragement as you heal. You are important! BRAINSTORM . Make a list of local resources, including resources for mom and for partner. . Identify support groups. . Identify people to call late at night - include names and contact info. . Talk with partner about perinatal mood and anxiety disorders. . Talk with your OB, midwife, and doula about baby blues and about perinatal mood and anxiety disorders. . Talk with your pediatrician about perinatal mood and anxiety disorders.   Support & Sanity Savers   What do you really need?  . Basics . In preparing for a new baby, many expectant parents spend  hours shopping for baby clothes, decorating the nursery, and deciding which car seat to buy. Yet most don't think much about what the reality of parenting a newborn will be like, and what they need to make  it through that. So, here is the advice of experienced parents. We know you'll read this, and think "they're exaggerating, I don't really need that." Just trust [place] on these, OK? Plan for all of this, and if it turns out you don't need it, come back and teach [place] how you did it!  Satira Anis (Once baby's survival needs are met, make sure you attend to your own survival needs!) . Sleep . An average newborn sleeps 16-18 hours per day, over 6-7 sleep periods, rarely more than three hours at a time. It is normal and healthy for a newborn to wake throughout the night... but really hard on parents!! . Naps. Prioritize sleep above any responsibilities like: cleaning house, visiting friends, running errands, etc.  Sleep whenever baby sleeps. If you can't nap, at least have restful times when baby eats. The more rest you get, the more patient you will be, the more emotionally stable, and better at solving problems.  . Food . You may not have realized it would be difficult to eat when you have a newborn. Yet, when we talk to . countless new parents, they say things like "it may be 2:00 pm when I realize I haven't had breakfast yet." Or "every time we sit down to dinner, baby needs to eat, and my food gets cold, so I don't bother to eat it." . Finger food. Before your baby is born, stock up with one months' worth of food that: 1) you can eat with one hand while holding a baby, 2) doesn't need to be prepped, 3) is good hot or cold, 4) doesn't spoil when left out for a few hours, and 5) you like to eat. Think about: nuts, dried fruit, Clif bars, pretzels, jerky, gogurt, baby carrots, apples, bananas, crackers, cheez-n-crackers, string cheese, hot pockets or frozen burritos to microwave, garden burgers and breakfast  pastries to put in the toaster, yogurt drinks, etc. . Restaurant Menus. Make lists of your favorite restaurants & menu items. When family/friends want to help, you can give specific information without much thought. They can either bring you the food or send gift cards for just the right meals. Rosaura Carpenter Meals.  Take some time to make a few meals to put in the freezer ahead of time.  Easy to freeze meals can be anything such as soup, lasagna, chicken pie, or spaghetti sauce. . Set up a Meal Schedule.  Ask friends and family to sign up to bring you meals during the first few weeks of being home. (It can be passed around at baby showers!) You have no idea how helpful this will be until you are in the throes of parenting.  https://hamilton-woodard.com/ is a great website to check out. . Emotional Support . Know who to call when you're stressed out. Parenting a newborn is very challenging work. There are times when it totally overwhelms your normal coping abilities. EVERY NEW PARENT NEEDS TO HAVE A PLAN FOR WHO TO CALL WHEN THEY JUST CAN'T COPE ANY MORE. (And it has to be someone other than the baby's other parent!) Before your baby is born, come up with at least one person you can call for support - write their phone number down and post it on the refrigerator. Marland Kitchen Anxiety & Sadness. Baby blues are normal after pregnancy; however, there are more severe types of anxiety & sadness which can occur and should not be ignored.  They are always treatable, but you have to take the first step by  reaching out for help. [facility] offers a "Mom Talk" group which meets every Tuesday from 10 am - 11 am.  This group is for new moms who need support and connection after their babies are born.  Call [phone].  Marland Kitchen Really, Really Helpful (Plan for them! Make sure these happen often!!) . Physical Support with Taking Care of Yourselves . Asking friends and family. Before your baby is born, set up a schedule of people who can come  and visit and help out (or ask a friend to schedule for you). Any time someone says "let me know what I can do to help," sign them up for a day. When they get there, their job is not to take care of the baby (that's your job and your joy). Their job is to take care of you!  . Postpartum doulas. If you don't have anyone you can call on for support, look into postpartum doulas:  professionals at helping parents with caring for baby, caring for themselves, getting breastfeeding started, and helping with household tasks. www.padanc.org is a helpful website for learning about doulas in our area. . Peer Support / Parent Groups . Why: One of the greatest ideas for new parents is to be around other new parents. Parent groups give you a chance to share and listen to others who are going through the same season of life, get a sense of what is normal infant development by watching several babies learn and grow, share your stories of triumph and struggles with empathetic ears, and forgive your own mistakes when you realize all parents are learning by trial and error. . Where to find: There are many places you can meet other new parents throughout our community.  [facility] offers the following classes for new moms and their little ones:  Baby and Me (Birth to [place]) and Breastfeeding Support Group. Go to www.conehealthybaby.com or call [phone] for more information. . Time for your Relationship . It's easy to get so caught up in meeting baby's immediate needs that it's hard to find time to connect with your partner, and meet the needs of your relationship. It's also easy to forget what "quality time with your partner" actually looks like. If you take your baby on a date, you'd be amazed how much of your couple time is spent feeding the baby, diapering the baby, admiring the baby, and talking about the baby. . Dating: Try to take time for just the two of you. Babysitter tip: Sometimes when moms are  breastfeeding a newborn, they find it hard to figure out how to schedule outings around baby's unpredictable feeding schedules. Have the babysitter come for a three hour period. When she comes over, if baby has just eaten, you can leave right away, and come back in two hours. If baby hasn't fed recently, you start the date at home. Once baby gets hungry and gets a good feeding in, you can head out for the rest of your date time. . Date Nights at Home: If you can't get out, at least set aside one evening a week to prioritize your relationship: whenever baby dozes off or doesn't have any immediate needs, spend a little time focusing on each other. . Potential conflicts: The main relationship conflicts that come up for new parents are: issues related to sexuality, financial stresses, a feeling of an unfair division of household tasks, and conflicts in parenting styles. The more you can work on these issues before baby arrives, the better!  Marland Kitchen  Fun and Frills (Don't forget these. and don't feel guilty for indulging in them!) . Everyone has something in life that is a fun little treat that they do just for themselves. It may be: reading the morning paper, or going for a daily jog, or having coffee with a friend once a week, or going to a movie on Friday nights, or fine chocolates, or bubble baths, or curling up with a good book. . Unless you do fun things for yourself every now and then, it's hard to have the energy for fun with your baby. Whatever your "special" treats are, make sure you find a way to continue to indulge in them after your baby is born. These special moments can recharge you, and allow you to return to baby with a new joy   PERINATAL MOOD DISORDERS: MATERNAL MENTAL HEALTH FROM CONCEPTION THROUGH THE POSTPARTUM PERIOD   Emergency and Crisis Resources:  If you are an imminent risk to self or others, are experiencing intense personal distress, and/or have noticed significant changes in activities  of daily living, call:  . 911 . [facility]: [phone] . Mobile Crisis: 877-626-1772 . National Suicide Hotline: 1-800-273-8255 Or visit the following crisis centers: . Local Emergency Departments . [facility]: [address], [place] [phone]. Hours: 8:30AM-5PM. Insurance Accepted: Medicaid, Medicare, and Uninsured.  . [facility]  [address], [place] Mon-Friday 8am-3pm  [phone]                                                                                    Non-Crisis Resources: To identify specific providers that are covered by your insurance, contact your insurance company or local agencies: [facility]: 1-800-256-2452 [facility]: 888-581-9988 [employer]: 1-800-939-5911 Postpartum Support International- [place] 1-800-944-4773                                                      Outpatient therapy and medication management providers:  [facility] [phone] Hours: 9AM-5PM  Insurance Accepted: AARP, Aetna, BCBS, Cigna, [employer], Humana, Medicare  [facility] [phone] Hours: 8AM-5PM  nsurance Accepted: All insurances EXCEPT AARP, Aetna, [employer], and Humana [facility]: [phone]             Hours: 8AM-8PM Insurance Accepted: Aetna, BCBS, Cigna, [employer], Medicaid, Medicare, Uninsured [facility]: [phone] [facility]: [phone] Hours: 8:30AM-7PM Insurance Accepted: Aetna, BCBS, Medicaid, Medicare, Tricare, United Healthcare [facility]:  [phone]              Hours:9AM-5PM Insurance Accepted:  Aetna, BCBS, [facility], Medicaid, United Health Care  [facility] [phone] Hours: 9AM-5:30PM Insurance Accepted: AARP, Aetna, BCBS, Cigna, and Medicaid, Medicare, United Health Care [facility]:   [phone] Hours: 9am-5pm Insurance Accepted: BCBS; they do not accept Medicaid/Medicare [facility]: [phone] Hours: 9am-9pm Insurance Accepted: All major insurance including Medicaid and Medicare [facility]: [phone] Hours: 9AM-5:30PM Insurance   Accepted: All insurances EXCEPT Medicaid and Medicare. [facility]: [place]: [phone] [place]:  [employer] (support for children in the NICU and/or with special needs), [employer]: [phone]                                                                                     Online Resources: Postpartum Support International: http://jones-berg.com/  800-944-4PPD 2Moms Supporting Moms:  www.momssupportingmoms.net                                                                                                 [facility] [place] - After Baby                                           All classes (childbirth and postpartum) can be found at www.postpartum.net

## 2019-05-18 ENCOUNTER — Other Ambulatory Visit: Payer: Self-pay | Admitting: Advanced Practice Midwife

## 2019-05-18 ENCOUNTER — Telehealth: Payer: Self-pay | Admitting: *Deleted

## 2019-05-18 MED ORDER — ALBUTEROL SULFATE HFA 108 (90 BASE) MCG/ACT IN AERS: 2.0000 | INHALATION_SPRAY | Freq: Four times a day (QID) | RESPIRATORY_TRACT | 3 refills | Status: DC | PRN

## 2019-05-18 NOTE — Telephone Encounter (Signed)
Pt has asthma attack last night. She needs a refill on her inhaler. She is afraid to run out of it.

## 2019-05-18 NOTE — Telephone Encounter (Signed)
done

## 2019-05-22 ENCOUNTER — Other Ambulatory Visit: Payer: Self-pay

## 2019-05-22 ENCOUNTER — Ambulatory Visit (HOSPITAL_COMMUNITY): Payer: Medicaid Other | Admitting: *Deleted

## 2019-05-22 ENCOUNTER — Encounter: Payer: Self-pay | Admitting: Obstetrics & Gynecology

## 2019-05-22 ENCOUNTER — Encounter (HOSPITAL_COMMUNITY): Payer: Self-pay | Admitting: *Deleted

## 2019-05-22 ENCOUNTER — Ambulatory Visit (HOSPITAL_COMMUNITY)
Admission: RE | Admit: 2019-05-22 | Discharge: 2019-05-22 | Disposition: A | Discharge status: Home / Self Care | Payer: Medicaid Other | Source: Ambulatory Visit | Attending: Obstetrics and Gynecology | Admitting: Obstetrics and Gynecology

## 2019-05-22 ENCOUNTER — Other Ambulatory Visit: Payer: Medicaid Other

## 2019-05-22 ENCOUNTER — Other Ambulatory Visit (HOSPITAL_COMMUNITY): Payer: Self-pay | Admitting: *Deleted

## 2019-05-22 ENCOUNTER — Ambulatory Visit (INDEPENDENT_AMBULATORY_CARE_PROVIDER_SITE_OTHER): Payer: Medicaid Other | Admitting: Obstetrics & Gynecology

## 2019-05-22 VITALS — BP 120/75 | HR 91 | Wt 233.0 lb

## 2019-05-22 DIAGNOSIS — O0990 Supervision of high risk pregnancy, unspecified, unspecified trimester: Secondary | ICD-10-CM

## 2019-05-22 DIAGNOSIS — Z362 Encounter for other antenatal screening follow-up: Secondary | ICD-10-CM | POA: Diagnosis not present

## 2019-05-22 DIAGNOSIS — O30102 Triplet pregnancy, unspecified number of placenta and unspecified number of amniotic sacs, second trimester: Secondary | ICD-10-CM

## 2019-05-22 DIAGNOSIS — Z3A27 27 weeks gestation of pregnancy: Secondary | ICD-10-CM

## 2019-05-22 DIAGNOSIS — O0992 Supervision of high risk pregnancy, unspecified, second trimester: Secondary | ICD-10-CM

## 2019-05-22 DIAGNOSIS — Z331 Pregnant state, incidental: Secondary | ICD-10-CM

## 2019-05-22 NOTE — Progress Notes (Signed)
   HIGH-RISK PREGNANCY VISIT Patient name: Elizabeth Stuart MRN [id number]  Date of birth: [birth year] Chief Complaint:   Routine Prenatal Visit  History of Present Illness:   Elizabeth Stuart is a 24 y.o. G109P0000 female at [redacted]w[redacted]d with an Estimated Date of Delivery: 08/20/19 being seen today for ongoing management of a high-risk pregnancy complicated by spontaneous triplets with loss of 2 fetuses at 8/11 weeks.  Today she reports no complaints. Contractions: Not present. Vag. Bleeding: None.  Movement: Present. denies leaking of fluid.  Review of Systems:   Pertinent items are noted in HPI Denies abnormal vaginal discharge w/ itching/odor/irritation, headaches, visual changes, shortness of breath, chest pain, abdominal pain, severe nausea/vomiting, or problems with urination or bowel movements unless otherwise stated above. Pertinent History Reviewed:  Reviewed past medical,surgical, social, obstetrical and family history.  Reviewed problem list, medications and allergies. Physical Assessment:   Vitals:   05/22/19 0857  BP: 120/75  Pulse: 91  Weight: 233 lb (105.7 kg)  Body mass index is 42.62 kg/m.           Physical Examination:   General appearance: alert, well appearing, and in no distress  Mental status: alert, oriented to person, place, and time  Skin: warm & dry   Extremities: Edema: Trace    Cardiovascular: normal heart rate noted  Respiratory: normal respiratory effort, no distress  Abdomen: gravid, soft, non-tender  Pelvic: Cervical exam deferred         Fetal Status: Fetal Heart Rate (bpm): 140 Fundal Height: 27 cm Movement: Present    Fetal Surveillance Testing today:    Chaperone: n/a    No results found for this or any previous visit (from the past 24 hour(s)).  Assessment & Plan:  1) High-risk pregnancy G1P0000 at [redacted]w[redacted]d with an Estimated Date of Delivery: 08/20/19   2) Loss of 2 fetuses, spontaneous triplets, no probems with the singleton at this point    Meds: No  orders of the defined types were placed in this encounter.   Labs/procedures today: PN2  Treatment Plan:  Sonogram 32 weeks for growth, otherwise routine care  Reviewed: Preterm labor symptoms and general obstetric precautions including but not limited to vaginal bleeding, contractions, leaking of fluid and fetal movement were reviewed in detail with the patient.  All questions were answered. Has home bp cuff. Rx faxed to . Check bp weekly, let us know if >140/90.   Follow-up: Return in about 3 weeks (around 06/12/2019) for HROB.  No orders of the defined types were placed in this encounter.  Lazaro Arms 05/22/2019 9:26 AM

## 2019-05-23 LAB — CBC
Hematocrit: 34.4 % (ref 34.0–46.6)
Hemoglobin: 11.4 g/dL (ref 11.1–15.9)
MCH: 28.5 pg (ref 26.6–33.0)
MCHC: 33.1 g/dL (ref 31.5–35.7)
MCV: 86 fL (ref 79–97)
Platelets: 355 10*3/uL (ref 150–450)
RBC: 4 x10E6/uL (ref 3.77–5.28)
RDW: 13.2 % (ref 11.7–15.4)
WBC: 9.9 10*3/uL (ref 3.4–10.8)

## 2019-05-23 LAB — RPR: RPR Ser Ql: NONREACTIVE

## 2019-05-23 LAB — GLUCOSE TOLERANCE, 2 HOURS W/ 1HR
Glucose, 1 hour: 130 mg/dL (ref 65–179)
Glucose, 2 hour: 112 mg/dL (ref 65–152)
Glucose, Fasting: 82 mg/dL (ref 65–91)

## 2019-05-23 LAB — ANTIBODY SCREEN: Antibody Screen: NEGATIVE

## 2019-05-23 LAB — HIV ANTIBODY (ROUTINE TESTING W REFLEX): HIV Screen 4th Generation wRfx: NONREACTIVE

## 2019-06-05 ENCOUNTER — Other Ambulatory Visit: Payer: Self-pay

## 2019-06-05 ENCOUNTER — Ambulatory Visit (INDEPENDENT_AMBULATORY_CARE_PROVIDER_SITE_OTHER): Payer: Medicaid Other | Admitting: Clinical

## 2019-06-05 DIAGNOSIS — F4321 Adjustment disorder with depressed mood: Secondary | ICD-10-CM

## 2019-06-05 DIAGNOSIS — Z658 Other specified problems related to psychosocial circumstances: Secondary | ICD-10-CM | POA: Diagnosis not present

## 2019-06-05 NOTE — BH Specialist Note (Signed)
Integrated Behavioral Health via Telemedicine Phone Visit  06/05/2019 Elizabeth Stuart [id number]  Number of [place] visits: 2 Session Start time: 2:27  Session End time: 2:42 Total time: 15  Referring Provider: Serita Stuart, CNM Type of Visit: Phone Patient/Family location: Home [facility] Provider location: [facility] All persons participating in visit: Patient Elizabeth Stuart and [place]    Confirmed patient's address: Yes  Confirmed patient's phone number: Yes  Any changes to demographics: No   Confirmed patient's insurance: Yes  Any changes to patient's insurance: No   Discussed confidentiality: at previous visit  I connected with Elizabeth Stuart a video enabled telemedicine application and verified that I am speaking with the correct person using two identifiers.     I discussed the limitations of evaluation and management by telemedicine and the availability of in person appointments.  I discussed that the purpose of this visit is to provide behavioral health care while limiting exposure to the novel coronavirus.   Discussed there is a possibility of technology failure and discussed alternative modes of communication if that failure occurs.  I discussed that engaging in this video visit, they consent to the provision of behavioral healthcare and the services will be billed under their insurance.  Patient and/or legal guardian expressed understanding and consented to video visit: Yes   PRESENTING CONCERNS: Patient and/or family reports the following symptoms/concerns: Pt states her primary concern today is worry about childbirth itself, as she would like her mother and FOB to both be present.  Duration of problem: Over 4 months; Severity of problem: mild  STRENGTHS (Protective Factors/Coping Skills): Good social support  GOALS ADDRESSED: Patient will: 1.  Reduce symptoms of: stress  2.  Increase knowledge and/or ability of: stress reduction  3.   Demonstrate ability to: Increase healthy adjustment to current life circumstances and Continue healthy grieving over loss  INTERVENTIONS: Interventions utilized:  Psychoeducation and/or Health Education and Link to [employer] Standardized Assessments completed: GAD-7 and PHQ 9  ASSESSMENT: Patient currently experiencing Grief and Psychosocial stress  Patient may benefit from continued psychoeducation and brief therapeutic interventions regarding coping with grief and life stress .  PLAN: 1. Follow up with behavioral health clinician on : One month 2. Behavioral recommendations:  -Continue taking prenatal vitamin daily -Medical laboratory scientific officer of [facility] [employer] [facility] [employer] and Sign up for and attend childbirth education class of choice (with FOB and mother, if possible) at [email or web address]  3. Referral(s): [facility] (In Clinic)  I discussed the assessment and treatment plan with the patient and/or parent/guardian. They were provided an opportunity to ask questions and all were answered. They agreed with the plan and demonstrated an understanding of the instructions.   They were advised to call back or seek an in-person evaluation if the symptoms worsen or if the condition fails to improve as anticipated.  Elizabeth Stuart   Depression screen [facility] 2/9 06/05/2019 02/17/2019  Decreased Interest 1 1  Down, Depressed, Hopeless 0 1  PHQ - 2 Score 1 2  Altered sleeping 0 0  Tired, decreased energy 0 0  Change in appetite 0 0  Feeling bad or failure about yourself  0 0  Trouble concentrating 0 0  Moving slowly or fidgety/restless 0 0  Suicidal thoughts 0 0  PHQ-9 Score 1 2  Difficult doing work/chores - Not difficult at all   GAD 7 : Generalized Anxiety Score 06/05/2019  Nervous, Anxious, on Edge 0  Control/stop worrying 0  Worry too much - different things 0  Trouble relaxing 0  Restless 0  Easily annoyed or irritable 1  Afraid - awful  might happen 1  Total GAD 7 Score 2

## 2019-06-12 ENCOUNTER — Other Ambulatory Visit: Payer: Self-pay

## 2019-06-12 ENCOUNTER — Ambulatory Visit (INDEPENDENT_AMBULATORY_CARE_PROVIDER_SITE_OTHER): Payer: Medicaid Other | Admitting: Women's Health

## 2019-06-12 ENCOUNTER — Encounter: Payer: Self-pay | Admitting: Women's Health

## 2019-06-12 VITALS — BP 132/78 | HR 97 | Wt 248.0 lb

## 2019-06-12 DIAGNOSIS — Z23 Encounter for immunization: Secondary | ICD-10-CM

## 2019-06-12 DIAGNOSIS — Z1389 Encounter for screening for other disorder: Secondary | ICD-10-CM

## 2019-06-12 DIAGNOSIS — O0993 Supervision of high risk pregnancy, unspecified, third trimester: Secondary | ICD-10-CM

## 2019-06-12 DIAGNOSIS — O0990 Supervision of high risk pregnancy, unspecified, unspecified trimester: Secondary | ICD-10-CM

## 2019-06-12 DIAGNOSIS — Z3A30 30 weeks gestation of pregnancy: Secondary | ICD-10-CM

## 2019-06-12 DIAGNOSIS — Z331 Pregnant state, incidental: Secondary | ICD-10-CM

## 2019-06-12 DIAGNOSIS — O30133 Triplet pregnancy, trichorionic/triamniotic, third trimester: Secondary | ICD-10-CM

## 2019-06-12 LAB — POCT URINALYSIS DIPSTICK OB
Glucose, UA: NEGATIVE
Ketones, UA: NEGATIVE
Nitrite, UA: NEGATIVE
POC,PROTEIN,UA: NEGATIVE

## 2019-06-12 NOTE — Patient Instructions (Addendum)
Elizabeth Stuart, I greatly value your feedback.  If you receive a survey following your visit with [place] today, we appreciate you taking the time to fill it out.  Thanks, [name], CNM, [facility]  [place]!!! It is now [place] at [facility] ([place], [place] [address]) Entrance located off of [address] parking    Go to [email or web address] to register for FREE online childbirth classes   Call the office [phone]) or go to [facility] if:  You begin to have strong, frequent contractions  Your water breaks.  Sometimes it is a big gush of fluid, sometimes it is just a trickle that keeps getting your panties wet or running down your legs  You have vaginal bleeding.  It is normal to have a small amount of spotting if your cervix was checked.   You don't feel your baby moving like normal.  If you don't, get you something to eat and drink and lay down and focus on feeling your baby move.  You should feel at least 10 movements in 2 hours.  If you don't, you should call the office or go to [facility].    Tdap Vaccine  It is recommended that you get the Tdap vaccine during the third trimester of EACH pregnancy to help protect your baby from getting pertussis (whooping cough)  27-36 weeks is the BEST time to do this so that you can pass the protection on to your baby. During pregnancy is better than after pregnancy, but if you are unable to get it during pregnancy it will be offered at the hospital.   You can get this vaccine with [place], at the health department, your family doctor, or some local pharmacies  Everyone who will be around your baby should also be up-to-date on their vaccines before the baby comes. Adults (who are not pregnant) only need 1 dose of Tdap during adulthood.   [place] Pediatricians/Family Doctors:  [facility] [phone]                  [place] [phone] (usually not accepting new patients unless you have family there already, you are always welcome to call and ask)       [facility] Department [phone]       [facility] Pediatricians/Family Doctors:   [facility]: [phone]  [facility]: [phone]  [facility]: [place] Doctors:   [facility]: [facility]: [place]:  [place]: [phone]   Home Blood Pressure Monitoring for Patients   Your provider has recommended that you check your blood pressure (BP) at least once a week at home. If you do not have a blood pressure cuff at home, one will be provided for you. Contact your provider if you have not received your monitor within 1 week.   Helpful Tips for Accurate Home Blood Pressure Checks  . Don't smoke, exercise, or drink caffeine 30 minutes before checking your BP . Use the restroom before checking your BP (a full bladder can raise your pressure) . Relax in a comfortable upright chair . Feet on the ground . Left arm resting comfortably on a flat surface at the level of your heart . Legs uncrossed . Back supported . Sit quietly and don't  talk . Place the cuff on your bare arm . Adjust snuggly, so that only two fingertips can fit between your skin and the top of the cuff . Check 2 readings separated by at least one minute . Keep a log of your BP readings . For a visual, please reference this diagram: http://ccnc.care/bpdiagram  Provider Name: [facility]     Phone: [phone]  Zone 1: ALL CLEAR  Continue to monitor your symptoms:  . BP reading is less than 140 (top number) or less than 90 (bottom number)  . No right upper stomach pain . No headaches or seeing spots . No feeling nauseated or throwing up . No swelling in face and  hands  Zone 2: CAUTION Call your doctor's office for any of the following:  . BP reading is greater than 140 (top number) or greater than 90 (bottom number)  . Stomach pain under your ribs in the middle or right side . Headaches or seeing spots . Feeling nauseated or throwing up . Swelling in face and hands  Zone 3: EMERGENCY  Seek immediate medical care if you have any of the following:  . BP reading is greater than160 (top number) or greater than 110 (bottom number) . Severe headaches not improving with Tylenol . Serious difficulty catching your breath . Any worsening symptoms from Zone 2   Third Trimester of Pregnancy The third trimester is from week 29 through week 42, months 7 through 9. The third trimester is a time when the fetus is growing rapidly. At the end of the ninth month, the fetus is about 20 inches in length and weighs 6-10 pounds.  BODY CHANGES Your body goes through many changes during pregnancy. The changes vary from woman to woman.   Your weight will continue to increase. You can expect to gain 25-35 pounds (11-16 kg) by the end of the pregnancy.  You may begin to get stretch marks on your hips, abdomen, and breasts.  You may urinate more often because the fetus is moving lower into your pelvis and pressing on your bladder.  You may develop or continue to have heartburn as a result of your pregnancy.  You may develop constipation because certain hormones are causing the muscles that push waste through your intestines to slow down.  You may develop hemorrhoids or swollen, bulging veins (varicose veins).  You may have pelvic pain because of the weight gain and pregnancy hormones relaxing your joints between the bones in your pelvis. Backaches may result from overexertion of the muscles supporting your posture.  You may have changes in your hair. These can include thickening of your hair, rapid growth, and changes in texture. Some women also have hair loss during  or after pregnancy, or hair that feels dry or thin. Your hair will most likely return to normal after your baby is born.  Your breasts will continue to grow and be tender. A yellow discharge may leak from your breasts called colostrum.  Your belly button may stick out.  You may feel short of breath because of your expanding uterus.  You may notice the fetus "dropping," or moving lower in your abdomen.  You may have a bloody mucus discharge. This usually occurs a few days to a week before labor begins.  Your cervix becomes thin and soft (effaced) near your due date. WHAT TO EXPECT AT YOUR PRENATAL EXAMS  You will have prenatal exams every 2 weeks until week 36. Then, you will have weekly prenatal  exams. During a routine prenatal visit:  You will be weighed to make sure you and the fetus are growing normally.  Your blood pressure is taken.  Your abdomen will be measured to track your baby's growth.  The fetal heartbeat will be listened to.  Any test results from the previous visit will be discussed.  You may have a cervical check near your due date to see if you have effaced. At around 36 weeks, your caregiver will check your cervix. At the same time, your caregiver will also perform a test on the secretions of the vaginal tissue. This test is to determine if a type of bacteria, Group B streptococcus, is present. Your caregiver will explain this further. Your caregiver may ask you:  What your birth plan is.  How you are feeling.  If you are feeling the baby move.  If you have had any abnormal symptoms, such as leaking fluid, bleeding, severe headaches, or abdominal cramping.  If you have any questions. Other tests or screenings that may be performed during your third trimester include:  Blood tests that check for low iron levels (anemia).  Fetal testing to check the health, activity level, and growth of the fetus. Testing is done if you have certain medical conditions or if  there are problems during the pregnancy. FALSE LABOR You may feel small, irregular contractions that eventually go away. These are called Braxton Hicks contractions, or false labor. Contractions may last for hours, days, or even weeks before true labor sets in. If contractions come at regular intervals, intensify, or become painful, it is best to be seen by your caregiver.  SIGNS OF LABOR   Menstrual-like cramps.  Contractions that are 5 minutes apart or less.  Contractions that start on the top of the uterus and spread down to the lower abdomen and back.  A sense of increased pelvic pressure or back pain.  A watery or bloody mucus discharge that comes from the vagina. If you have any of these signs before the 37th week of pregnancy, call your caregiver right away. You need to go to the hospital to get checked immediately. HOME CARE INSTRUCTIONS   Avoid all smoking, herbs, alcohol, and unprescribed drugs. These chemicals affect the formation and growth of the baby.  Follow your caregiver's instructions regarding medicine use. There are medicines that are either safe or unsafe to take during pregnancy.  Exercise only as directed by your caregiver. Experiencing uterine cramps is a good sign to stop exercising.  Continue to eat regular, healthy meals.  Wear a good support bra for breast tenderness.  Do not use hot tubs, steam rooms, or saunas.  Wear your seat belt at all times when driving.  Avoid raw meat, uncooked cheese, cat litter boxes, and soil used by cats. These carry germs that can cause birth defects in the baby.  Take your prenatal vitamins.  Try taking a stool softener (if your caregiver approves) if you develop constipation. Eat more high-fiber foods, such as fresh vegetables or fruit and whole grains. Drink plenty of fluids to keep your urine clear or pale yellow.  Take warm sitz baths to soothe any pain or discomfort caused by hemorrhoids. Use hemorrhoid cream if your  caregiver approves.  If you develop varicose veins, wear support hose. Elevate your feet for 15 minutes, 3-4 times a day. Limit salt in your diet.  Avoid heavy lifting, wear low heal shoes, and practice good posture.  Rest a lot with your legs elevated  if you have leg cramps or low back pain.  Visit your dentist if you have not gone during your pregnancy. Use a soft toothbrush to brush your teeth and be gentle when you floss.  A sexual relationship may be continued unless your caregiver directs you otherwise.  Do not travel far distances unless it is absolutely necessary and only with the approval of your caregiver.  Take prenatal classes to understand, practice, and ask questions about the labor and delivery.  Make a trial run to the hospital.  Pack your hospital bag.  Prepare the baby's nursery.  Continue to go to all your prenatal visits as directed by your caregiver. SEEK MEDICAL CARE IF:  You are unsure if you are in labor or if your water has broken.  You have dizziness.  You have mild pelvic cramps, pelvic pressure, or nagging pain in your abdominal area.  You have persistent nausea, vomiting, or diarrhea.  You have a bad smelling vaginal discharge.  You have pain with urination. SEEK IMMEDIATE MEDICAL CARE IF:   You have a fever.  You are leaking fluid from your vagina.  You have spotting or bleeding from your vagina.  You have severe abdominal cramping or pain.  You have rapid weight loss or gain.  You have shortness of breath with chest pain.  You notice sudden or extreme swelling of your face, hands, ankles, feet, or legs.  You have not felt your baby move in over an hour.  You have severe headaches that do not go away with medicine.  You have vision changes. Document Released: 03/17/2001 Document Revised: 03/28/2013 Document Reviewed: 05/24/2012 [facility] Patient Information 2015 [place], [place]. This information is not intended to replace advice  given to you by your health care provider. Make sure you discuss any questions you have with your health care provider.  PROTECT YOURSELF & YOUR BABY FROM THE FLU! Because you are pregnant, we at [facility], along with the Centers for Disease Control (CDC), recommend that you receive the flu vaccine to protect yourself and your baby from the flu. The flu is more likely to cause severe illness in pregnant women than in women of reproductive age who are not pregnant. Changes in the immune system, heart, and lungs during pregnancy make pregnant women (and women up to two weeks postpartum) more prone to severe illness from flu, including illness resulting in hospitalization. Flu also may be harmful for a pregnant woman's developing baby. A common flu symptom is fever, which may be associated with neural tube defects and other adverse outcomes for a developing baby. Getting vaccinated can also help protect a baby after birth from flu. (Mom passes antibodies onto the developing baby during her pregnancy.)  A Flu Vaccine is the Best Protection Against Flu Getting a flu vaccine is the first and most important step in protecting against flu. Pregnant women should get a flu shot and not the live attenuated influenza vaccine (LAIV), also known as nasal spray flu vaccine. Flu vaccines given during pregnancy help protect both the mother and her baby from flu. Vaccination has been shown to reduce the risk of flu-associated acute respiratory infection in pregnant women by up to one-half. A 2018 study showed that getting a flu shot reduced a pregnant woman's risk of being hospitalized with flu by an average of 40 percent. Pregnant women who get a flu vaccine are also helping to protect their babies from flu illness for the first several months after their birth, when they  are too young to get vaccinated.   A Long Record of Safety for Flu Shots in Pregnant Women Flu shots have been given to millions of pregnant women over  many years with a good safety record. There is a lot of evidence that flu vaccines can be given safely during pregnancy; though these data are limited for the first trimester. The CDC recommends that pregnant women get vaccinated during any trimester of their pregnancy. It is very important for pregnant women to get the flu shot.   Other Preventive Actions In addition to getting a flu shot, pregnant women should take the same everyday preventive actions the CDC recommends of everyone, including covering coughs, washing hands often, and avoiding people who are sick.  Symptoms and Treatment If you get sick with flu symptoms call your doctor right away. There are antiviral drugs that can treat flu illness and prevent serious flu complications. The CDC recommends prompt treatment for people who have influenza infection or suspected influenza infection and who are at high risk of serious flu complications, such as people with asthma, diabetes (including gestational diabetes), or heart disease. Early treatment of influenza in hospitalized pregnant women has been shown to reduce the length of the hospital stay.  Symptoms Flu symptoms include fever, cough, sore throat, runny or stuffy nose, body aches, headache, chills and fatigue. Some people may also have vomiting and diarrhea. People may be infected with the flu and have respiratory symptoms without a fever.  Early Treatment is Important for Pregnant Women Treatment should begin as soon as possible because antiviral drugs work best when started early (within 48 hours after symptoms start). Antiviral drugs can make your flu illness milder and make you feel better faster. They may also prevent serious health problems that can result from flu illness. Oral oseltamivir (Tamiflu) is the preferred treatment for pregnant women because it has the most studies available to suggest that it is safe and beneficial. Antiviral drugs require a prescription from your  provider. Having a fever caused by flu infection or other infections early in pregnancy may be linked to birth defects in a baby. In addition to taking antiviral drugs, pregnant women who get a fever should treat their fever with Tylenol (acetaminophen) and contact their provider immediately.  When to [place] If you are pregnant and have any of these signs, seek care immediately:  Difficulty breathing or shortness of breath  Pain or pressure in the chest or abdomen  Sudden dizziness  Confusion  Severe or persistent vomiting  High fever that is not responding to Tylenol (or store brand equivalent)  Decreased or no movement of your baby  [employer].pt  Considering Waterbirth? Guide for patients at [facility] [employer]  Why consider waterbirth?  . Gentle birth for babies . Less pain medicine used in labor . May allow for passive descent/less pushing . May reduce perineal tears  . More mobility and instinctive maternal position changes . Increased maternal relaxation . Reduced blood pressure in labor  Is waterbirth safe? What are the risks of infection, drowning or other complications?  . Infection: o Very low risk (3.7 % for tub vs 4.8% for bed) o 7 in 8000 waterbirths with documented infection o Poorly cleaned equipment most common cause o Slightly lower group B strep transmission rate  . Drowning o Maternal:  - Very low risk   - Related to seizures or fainting o Newborn:  - Very low risk. No evidence of increased risk of  respiratory problems in multiple large studies - Physiological protection from breathing under water - Avoid underwater birth if there are any fetal complications - Once baby's head is out of the water, keep it out.  . Birth complication o Some reports of cord trauma, but risk decreased by bringing baby to surface gradually o No evidence of increased risk of shoulder dystocia.  Mothers can usually change positions faster in water than in a bed, possibly aiding the maneuvers to free the shoulder.   You must attend a Doren Custard class at [facility]  3rd Wednesday of every month from 7-9pm  [employer] by calling [phone] or online at [email or web address]  Bring [place] the certificate from the class to your prenatal appointment  Meet with a midwife at 36 weeks to see if you can still plan a waterbirth and to sign the consent.   If you plan a waterbirth at [facility] and [facility] at [facility] you will need to purchase the following:  Fish net  Bathing suit top (optional)  Long-handled mirror (optional)  Places to purchase or rent supplies:   [employer] for tub purchases and supplies  Waterbirthsolutions.com for tub purchases and supplies  [employer] (www.thelaborladies.com) $275 for tub rental/set-up & take down/kit   [employer] (http://www.fleming.com/.htm) Information regarding doulas (labor support) who provide pool rentals  Things that would prevent you from having a waterbirth:  Premature, <37wks  Previous cesarean birth  Presence of thick meconium-stained fluid  Multiple gestation (Twins, triplets, etc.)  Uncontrolled diabetes or gestational diabetes requiring medication  Hypertension requiring medication or diagnosis of pre-eclampsia  Heavy vaginal bleeding  Non-reassuring fetal heart rate  Active infection (MRSA, etc.). Group B Strep is NOT a contraindication for waterbirth.  If your labor has to be induced and induction method requires continuous monitoring of the baby's heart rate  Other risks/issues identified by your obstetrical provider  Please remember that birth is unpredictable. Under certain unforeseeable circumstances your provider may advise against giving birth in the tub. These decisions will be made on a case-by-case basis and with the safety of you  and your baby as our highest priority.

## 2019-06-12 NOTE — Progress Notes (Signed)
   HIGH-RISK PREGNANCY VISIT Patient name: Elizabeth Stuart MRN [id number]  Date of birth: [birth year] Chief Complaint:   High Risk Gestation  History of Present Illness:   Elizabeth Stuart is a 24 y.o. G29P0000 female at [redacted]w[redacted]d with an Estimated Date of Delivery: 08/20/19 being seen today for ongoing management of a high-risk pregnancy complicated by spontaneous triplets w/ loss of 2 fetuses at 8 & 11wks.  Today she reports no complaints. Contractions: Not present. Vag. Bleeding: None.  Movement: Present. denies leaking of fluid.  Review of Systems:   Pertinent items are noted in HPI Denies abnormal vaginal discharge w/ itching/odor/irritation, headaches, visual changes, shortness of breath, chest pain, abdominal pain, severe nausea/vomiting, or problems with urination or bowel movements unless otherwise stated above. Pertinent History Reviewed:  Reviewed past medical,surgical, social, obstetrical and family history.  Reviewed problem list, medications and allergies. Physical Assessment:   Vitals:   06/12/19 1012  BP: 132/78  Pulse: 97  Weight: 248 lb (112.5 kg)  Body mass index is 45.36 kg/m.           Physical Examination:   General appearance: alert, well appearing, and in no distress  Mental status: alert, oriented to person, place, and time  Skin: warm & dry   Extremities: Edema: Trace    Cardiovascular: normal heart rate noted  Respiratory: normal respiratory effort, no distress  Abdomen: gravid, soft, non-tender  Pelvic: Cervical exam deferred         Fetal Status: Fetal Heart Rate (bpm): 138 Fundal Height: 30 cm Movement: Present    Fetal Surveillance Testing today: doppler   Chaperone: n/a    Results for orders placed or performed in visit on 06/12/19 (from the past 24 hour(s))  POC Urinalysis Dipstick OB   Collection Time: 06/12/19 10:13 AM  Result Value Ref Range   Color, UA     Clarity, UA     Glucose, UA Negative Negative   Bilirubin, UA     Ketones, UA neg    Spec  Grav, UA     Blood, UA trace    pH, UA     POC,PROTEIN,UA Negative Negative, Trace, Small (1+), Moderate (2+), Large (3+), 4+   Urobilinogen, UA     Nitrite, UA neg    Leukocytes, UA Trace (A) Negative   Appearance     Odor      Assessment & Plan:  1) High-risk pregnancy G1P0000 at [redacted]w[redacted]d with an Estimated Date of Delivery: 08/20/19   2) Spontaneous triplets w/ loss of 2 fetuses @ 8 & 11wks, stable, EFW @ 32wks  3) Interested in waterbirth> sign up for class, printed info given  Meds: No orders of the defined types were placed in this encounter.   Labs/procedures today: tdap  Treatment Plan:  efw @ 32wks  Reviewed: Preterm labor symptoms and general obstetric precautions including but not limited to vaginal bleeding, contractions, leaking of fluid and fetal movement were reviewed in detail with the patient.  All questions were answered. Has home bp cuff.Check bp weekly, let us know if >140/90.   Follow-up: Return in about 2 weeks (around 06/26/2019) for HROB, US:EFW, MD or CNM, in person.  Orders Placed This Encounter  Procedures  . US OB Follow Up  . Tdap vaccine greater than or equal to 7yo IM  . POC Urinalysis Dipstick OB   Cheral Marker CNM, [facility] 06/12/2019 10:53 AM

## 2019-06-19 ENCOUNTER — Other Ambulatory Visit (HOSPITAL_COMMUNITY): Payer: Self-pay | Admitting: *Deleted

## 2019-06-19 ENCOUNTER — Ambulatory Visit (HOSPITAL_COMMUNITY): Payer: Medicaid Other | Admitting: *Deleted

## 2019-06-19 ENCOUNTER — Ambulatory Visit (HOSPITAL_COMMUNITY)
Admission: RE | Admit: 2019-06-19 | Discharge: 2019-06-19 | Disposition: A | Discharge status: Home / Self Care | Payer: Medicaid Other | Source: Ambulatory Visit | Attending: Obstetrics and Gynecology | Admitting: Obstetrics and Gynecology

## 2019-06-19 ENCOUNTER — Encounter (HOSPITAL_COMMUNITY): Payer: Self-pay | Admitting: *Deleted

## 2019-06-19 ENCOUNTER — Other Ambulatory Visit: Payer: Self-pay

## 2019-06-19 DIAGNOSIS — Z362 Encounter for other antenatal screening follow-up: Secondary | ICD-10-CM

## 2019-06-19 DIAGNOSIS — Z3A31 31 weeks gestation of pregnancy: Secondary | ICD-10-CM

## 2019-06-19 DIAGNOSIS — O30109 Triplet pregnancy, unspecified number of placenta and unspecified number of amniotic sacs, unspecified trimester: Secondary | ICD-10-CM

## 2019-06-19 DIAGNOSIS — O0990 Supervision of high risk pregnancy, unspecified, unspecified trimester: Secondary | ICD-10-CM | POA: Insufficient documentation

## 2019-06-28 ENCOUNTER — Other Ambulatory Visit: Payer: Medicaid Other

## 2019-06-28 ENCOUNTER — Encounter: Payer: Self-pay | Admitting: Obstetrics and Gynecology

## 2019-06-28 ENCOUNTER — Other Ambulatory Visit: Payer: Self-pay

## 2019-06-28 ENCOUNTER — Ambulatory Visit (INDEPENDENT_AMBULATORY_CARE_PROVIDER_SITE_OTHER): Payer: Medicaid Other | Admitting: Obstetrics and Gynecology

## 2019-06-28 VITALS — BP 136/87 | HR 98 | Wt 259.2 lb

## 2019-06-28 DIAGNOSIS — Z1389 Encounter for screening for other disorder: Secondary | ICD-10-CM

## 2019-06-28 DIAGNOSIS — Z3A32 32 weeks gestation of pregnancy: Secondary | ICD-10-CM

## 2019-06-28 DIAGNOSIS — Z331 Pregnant state, incidental: Secondary | ICD-10-CM

## 2019-06-28 DIAGNOSIS — O0990 Supervision of high risk pregnancy, unspecified, unspecified trimester: Secondary | ICD-10-CM

## 2019-06-28 DIAGNOSIS — O30133 Triplet pregnancy, trichorionic/triamniotic, third trimester: Secondary | ICD-10-CM

## 2019-06-28 LAB — POCT URINALYSIS DIPSTICK OB
Blood, UA: NEGATIVE
Glucose, UA: NEGATIVE
Ketones, UA: NEGATIVE
Nitrite, UA: NEGATIVE

## 2019-06-28 NOTE — Progress Notes (Signed)
Patient ID: Elizabeth Stuart, female   DOB: 04/24/1995, 24 y.o.   MRN: [id number]    [facility] Patient name: Elizabeth ConstantKiara A Huron MRN [id number]  Date of birth: 04/24/1995 Chief Complaint:   High Risk Gestation (swelling in feet)  History of Present Illness:   Elizabeth ConstantKiara A Freimuth is a 24 y.o. 161P0000 female at 6128w3d with an Estimated Date of Delivery: 08/20/19 being seen today for ongoing management of a high-risk pregnancy complicated by spontaneous triplets with loss of two fetuses, one at 8 weeks and one at 11 weeks. Today she reports no complaints and slight pedal edema. Contractions: Not present. Vag. Bleeding: None.  Movement: Present. denies leaking of fluid.   Most recent fetal ultrasound on 06/19/2019 showed that the fetus was 3 lbs and 6 oz, 12th percentile. ----------------------------------------------------------------------  OBSTETRICS REPORT                       (Signed Final 06/19/2019 04:46 pm) ---------------------------------------------------------------------- Patient Info  ID #:       [id number]                          D.O.B.:  Dec 02, 1995 (23 yrs)  Name:       Elizabeth Stuart                   Visit Date: 06/19/2019 03:04 pm ---------------------------------------------------------------------- Performed By  Performed By:     Sandi MealyJovancia Adrien        Ref. Address:     [address]                    [place]                                                             [address]  Attending:        Noralee Spaceavi Shankar MD        [facility]  [facility]:      [facility] ---------------------------------------------------------------------- Orders   #  Description                          Code         Ordered By   1  US MFM OB FOLLOW UP                  [id number]     Noralee SpaceAVI SHANKAR  ----------------------------------------------------------------------   #  Order #                    Accession #                  Episode #   1  [id number]  [id number]                  [id number]  ---------------------------------------------------------------------- Indications   Initial Triplet gestation,  now singleton      O81.109   Fetal demise less than 22 weeks (Triplet A     O02.1   AND C demise)   Encounter for other antenatal screening        Z36.2   follow-up   [redacted] weeks gestation of pregnancy                Z3A.31  ---------------------------------------------------------------------- Vital Signs                                                 Height:        5'3" ---------------------------------------------------------------------- Fetal Evaluation  Num Of Fetuses:         1  Fetal Heart Rate(bpm):  140  Cardiac Activity:       Observed  Presentation:           Cephalic  Placenta:               Posterior  P. Cord Insertion:      Previously Visualized  Amniotic Fluid  AFI FV:      Within normal limits  AFI Sum(cm)     %Tile       Largest Pocket(cm)  11.76           29          4.02  RUQ(cm)       RLQ(cm)       LUQ(cm)        LLQ(cm)  2.64          2.06          3.04           4.02 ---------------------------------------------------------------------- Biometry  BPD:      75.9  mm     G. Age:  30w 3d         20  %    CI:        74.88   %    70 - 86                                                          FL/HC:      20.0   %    19.3 - 21.3  HC:      278.3  mm     G. Age:  30w 3d          6  %    HC/AC:      1.05        0.96 - 1.17  AC:      264.6  mm     G. Age:  30w 4d         30  %    FL/BPD:     73.3   %    71 - 87  FL:       55.6  mm     G. Age:  29w 2d          4  %  FL/AC:      21.0   %    20 - 24  HUM:      50.3  mm     G. Age:  29w 3d         16  %  LV:        2.8  mm  Est. FW:    1519  gm      3 lb 6 oz     12  % ---------------------------------------------------------------------- OB History  Gravidity:    1         Term:   0        Prem:   0        SAB:    0  TOP:          0       Ectopic:  0        Living: 0 ---------------------------------------------------------------------- Gestational Age  LMP:           31w 1d        Date:  11/13/18                 EDD:   08/20/19  U/S Today:     30w 1d                                        EDD:   08/27/19  Best:          31w 1d     Det. By:  Loman Chroman         EDD:   08/20/19                                      (01/10/19) ---------------------------------------------------------------------- Anatomy  Cranium:               Appears                Aortic Arch:            Previously seen                         [id number]  Cavum:                 Appears normal         Ductal Arch:            Previously seen  Ventricles:            Appears normal         Diaphragm:              Appears normal  Choroid Plexus:        Previously seen        Stomach:                Appears normal, left                                                                        sided  Cerebellum:  Previously seen        Abdomen:                Appears normal  Posterior Fossa:       Previously seen        Abdominal Wall:         Previously seen  Nuchal Fold:           Not applicable (>20    Cord Vessels:           Previously seen                         wks GA)  Face:                  Orbits and profile     Kidneys:                Previously seen                         previously seen  Lips:                  Previously seen        Bladder:                Appears normal  Thoracic:              Appears normal         Spine:                  Previously seen  Heart:                 Appears normal         Upper Extremities:      Previously seen                         (4CH, axis, and                         situs)  RVOT:                  Previously seen        Lower Extremities:      Previously seen  LVOT:                  Previously  seen ---------------------------------------------------------------------- Doppler - Fetal Vessels  Umbilical Artery   S/D     %tile  3.64       89 ---------------------------------------------------------------------- Impression  Patient return for fetal growth assessment.  She does not  have gestational diabetes.  Her pregnancy started as triplet  gestation with fetal demise into fetuses.  Her blood pressures have been normal at prenatal visits.  Amniotic fluid is normal and good fetal activity is seen. Fetal  growth is appropriate for gestational age. Umbilical artery  Doppler showed normal forward diastolic flow. ---------------------------------------------------------------------- Recommendations  -An appointment was made for her to return in 3 weeks for  fetal growth assessment. ----------------------------------------------------------------------                  Noralee Space, MD Electronically Signed Final Report   06/19/2019 04:46 pm ----------------------------------------------------------------------     Review of Systems:   Pertinent items are noted in HPI Denies abnormal vaginal discharge w/ itching/odor/irritation, headaches, visual changes, shortness of breath, chest pain, abdominal pain, severe  nausea/vomiting, or problems with urination or bowel movements unless otherwise stated above. Pertinent History Reviewed:  Reviewed past medical,surgical, social, obstetrical and family history.  Reviewed problem list, medications and allergies. Physical Assessment:   Vitals:   06/28/19 1135  BP: 136/87  Pulse: 98  Weight: 259 lb 3.2 oz (117.6 kg)  Body mass index is 47.41 kg/m.           Physical Examination:   General appearance: alert, well appearing, and in no distress  Mental status: normal mood, behavior, speech, dress, motor activity, and thought processes, affect appropriate to mood  Skin: warm & dry   Extremities: Edema: Trace    Cardiovascular:  normal heart rate noted  Respiratory: normal respiratory effort, no distress  Abdomen: gravid, soft, non-tender  Pelvic: Cervical exam deferred         Fetal Status: Fetal Heart Rate (bpm): 156 Fundal Height: 32 cm Movement: Present    Fetal Surveillance Testing today: FHT   Chaperone: Nikki Dom    Results for orders placed or performed in visit on 06/28/19 (from the past 24 hour(s))  POC Urinalysis Dipstick OB   Collection Time: 06/28/19 11:32 AM  Result Value Ref Range   Color, UA     Clarity, UA     Glucose, UA Negative Negative   Bilirubin, UA     Ketones, UA neg    Spec Grav, UA     Blood, UA neg    pH, UA     POC,PROTEIN,UA Trace Negative, Trace, Small (1+), Moderate (2+), Large (3+), 4+   Urobilinogen, UA     Nitrite, UA neg    Leukocytes, UA Trace (A) Negative   Appearance     Odor      Assessment & Plan:  1) High-risk pregnancy G1P0000due to hx multiple gestation with spontaneous loss of baby A and Baby C Now att [redacted]w[redacted]d with an Estimated Date of Delivery: 08/20/19   2) borderline BP 136/87 - She states that she is consuming less sodium. Has BP cuff.  Meds: No orders of the defined types were placed in this encounter.  Labs/procedures today: None  Treatment Plan: Continue routine obstetric care  Reviewed: Preterm labor symptoms and general obstetric precautions including but not limited to vaginal bleeding, contractions, leaking of fluid and fetal movement were reviewed in detail with the patient.  All questions were answered. Has home bp cuff. Check bp weekly, let us know if >140/90.   Follow-up: Return in about 2 weeks (around 07/12/2019) for HROB.  Orders Placed This Encounter  Procedures  . POC Urinalysis Dipstick OB   By signing my name below, I, Nikki Dom, attest that this documentation has been prepared under the direction and in the presence of Tilda Burrow, MD. Electronically Signed: Nikki Dom Medical Scribe. 06/28/19. 12:17 PM.  I  personally performed the services described in this documentation, which was SCRIBED in my presence. The recorded information has been reviewed and considered accurate. It has been edited as necessary during review. Tilda Burrow, MD

## 2019-07-03 ENCOUNTER — Ambulatory Visit (INDEPENDENT_AMBULATORY_CARE_PROVIDER_SITE_OTHER): Payer: Medicaid Other | Admitting: Clinical

## 2019-07-03 DIAGNOSIS — Z658 Other specified problems related to psychosocial circumstances: Secondary | ICD-10-CM

## 2019-07-03 NOTE — BH Specialist Note (Signed)
Integrated Behavioral Health via Telemedicine Video Visit  07/03/2019 Elizabeth Stuart [id number]  Number of [facility] visits: 3 Session Start time: 2:47  Session End time: 2:57 Total time: 10  Referring Provider: Serita Grammes, CNM Type of Visit: Video Elizabeth/Family location: Home [facility] Provider location: [facility] All persons participating in visit: Elizabeth Stuart and Elizabeth Stuart    Confirmed Elizabeth's address: Yes  Confirmed Elizabeth's phone number: Yes  Any changes to demographics: No   Confirmed Elizabeth's insurance: No  Any changes to Elizabeth's insurance: No   Discussed confidentiality: At previous visit  I connected with Elizabeth Stuart  by a video enabled telemedicine application and verified that I am speaking with the correct person using two identifiers.     I discussed the limitations of evaluation and management by telemedicine and the availability of in person appointments.  I discussed that the purpose of this visit is to provide behavioral health care while limiting exposure to the novel coronavirus.   Discussed there is a possibility of technology failure and discussed alternative modes of communication if that failure occurs.  I discussed that engaging in this video visit, they consent to the provision of behavioral healthcare and the services will be billed under their insurance.  Elizabeth and/or legal guardian expressed understanding and consented to video visit: Yes   PRESENTING CONCERNS: Elizabeth and/or family reports the following symptoms/concerns: Pt states her primary concern today is feeling Braxton Hicks contractions, thinks belly may have dropped; pt feels that pregnancy is going well, feelings of grief have lifted; would like a brief mood check postpartum. No other concerns at this time.  Duration of problem: Current pregnancy; Severity of problem: mild  STRENGTHS (Protective Factors/Coping Skills): Good social support  GOALS  ADDRESSED: Elizabeth will: 1.  Demonstrate ability to: Increase healthy adjustment to current life circumstances  INTERVENTIONS: Interventions utilized:  Supportive Counseling and Link to [employer] Standardized Assessments completed: Not Needed  ASSESSMENT: Elizabeth currently experiencing Psychosocial stress.   Elizabeth may benefit from continued brief therapeutic interventions to manage emotions during perinatal period .  PLAN: 1. Follow up with behavioral health clinician on : Postpartum; call Rhett Mutschler at [phone] to set up appointment for postpartum mood check 2. Behavioral recommendations:  -Continue taking prenatal vitamin daily -Read through Postpartum Planner via After Visit Summary today  3. Referral(s): [place] (In Clinic)  I discussed the assessment and treatment plan with the Elizabeth and/or parent/guardian. They were provided an opportunity to ask questions and all were answered. They agreed with the plan and demonstrated an understanding of the instructions.   They were advised to call back or seek an in-person evaluation if the symptoms worsen or if the condition fails to improve as anticipated.  Elizabeth Stuart  Depression screen [facility] 2/9 06/05/2019 02/17/2019  Decreased Interest 1 1  Down, Depressed, Hopeless 0 1  PHQ - 2 Score 1 2  Altered sleeping 0 0  Tired, decreased energy 0 0  Change in appetite 0 0  Feeling bad or failure about yourself  0 0  Trouble concentrating 0 0  Moving slowly or fidgety/restless 0 0  Suicidal thoughts 0 0  PHQ-9 Score 1 2  Difficult doing work/chores - Not difficult at all   GAD 7 : Generalized Anxiety Score 06/05/2019  Nervous, Anxious, on Edge 0  Control/stop worrying 0  Worry too much - different things 0  Trouble relaxing 0  Restless 0  Easily annoyed or irritable 1  Afraid - awful might happen 1  Total GAD 7 Score 2

## 2019-07-03 NOTE — Patient Instructions (Signed)
BRAINSTORMING  Develop a Plan Goals: . Provide a way to start conversation about your new life with a baby . Assist parents in recognizing and using resources within their reach . Help pave the way before birth for an easier period of transition afterwards.  Make a list of the following information to keep in a central location: . Full name of Mom and Partner: _____________________________________________ . 33 full name and Date of Birth: ___________________________________________ . Home Address: ___________________________________________________________ ________________________________________________________________________ . Home Phone: ____________________________________________________________ . Parents' cell numbers: _____________________________________________________ ________________________________________________________________________ . Name and contact info for OB: ______________________________________________ . Name and contact info for Pediatrician:________________________________________ . Contact info for Lactation Consultants: ________________________________________  REST and SLEEP *You each need at least 4-5 hours of uninterrupted sleep every day. Write specific names and contact information.* . How are you going to rest in the postpartum period? While partner's home? When partner returns to work? When you both return to work? Marland Kitchen Where will your baby sleep? Marland Kitchen Who is available to help during the day? Evening? Night? . Who could move in for a period to help support you? Marland Kitchen What are some ideas to help you get enough sleep? __________________________________________________________________________________________________________________________________________________________________________________________________________________________________________ NUTRITIOUS FOOD AND DRINK *Plan for meals before your baby is born so you can have healthy food to eat  during the immediate postpartum period.* . Who will look after breakfast? Lunch? Dinner? List names and contact information. Brainstorm quick, healthy ideas for each meal. . What can you do before baby is born to prepare meals for the postpartum period? . How can others help you with meals? Marland Kitchen Which grocery stores provide online shopping and delivery? Marland Kitchen Which restaurants offer take-out or delivery options? ______________________________________________________________________________________________________________________________________________________________________________________________________________________________________________________________________________________________________________________________________________________________________________________________________  CARE FOR MOM *It's important that mom is cared for and pampered in the postpartum period. Remember, the most important ways new mothers need care are: sleep, nutrition, gentle exercise, and time off.* . Who can come take care of mom during this period? Make a list of people with their contact information. . List some activities that make you feel cared for, rested, and energized? Who can make sure you have opportunities to do these things? . Does mom have a space of her very own within your home that's just for her? Make a "Circles Of Care" where she can be comfortable, rest, and renew herself daily. ______________________________________________________________________________________________________________________________________________________________________________________________________________________________________________________________________________________________________________________________________________________________________________________________________    CARE FOR AND FEEDING BABY *Knowledgeable and encouraging people will offer the best support with regard to feeding your  baby.* . Educate yourself and choose the best feeding option for your baby. . Make a list of people who will guide, support, and be a resource for you as your care for and feed your baby. (Friends that have breastfed or are currently breastfeeding, lactation consultants, breastfeeding support groups, etc.) . Consider a postpartum doula. (These websites can give you information: dona.org & BuyingShow.es) . Seek out local breastfeeding resources like the breastfeeding support group at [employer] or [employer]. ______________________________________________________________________________________________________________________________________________________________________________________________________________________________________________________________________________________________________________________________________________________________________________________________________  Verner Chol AND ERRANDS . Who can help with a thorough cleaning before baby is born? . Make a list of people who will help with housekeeping and chores, like laundry, light cleaning, dishes, bathrooms, etc. . Who can run some errands for you? Marland Kitchen What can you do to make sure you are stocked with basic supplies before baby is born? . Who is going to do the shopping? ______________________________________________________________________________________________________________________________________________________________________________________________________________________________________________________________________________________________________________________________________________________________________________________________________     Family Adjustment *Nurture yourselves.it helps parents be more loving and allows for better bonding with their child.* .  What sorts of things do you and partner enjoy doing together? Which activities help you to connect and strengthen your relationship? Make a list of  those things. Make a list of people whom you trust to care for your baby so you can have some time together as a couple. . What types of things help partner feel connected to Mom? Make a list. . What needs will partner have in order to bond with baby? . Other children? Who will care for them when you go into labor and while you are in the hospital? . Think about what the needs of your older children might be. Who can help you meet those needs? In what ways are you helping them prepare for bringing baby home? List some specific strategies you have for family adjustment. _______________________________________________________________________________________________________________________________________________________________________________________________________________________________________________________________________________________________________________________________________________  SUPPORT *Someone who can empathize with experiences normalizes your problems and makes them more bearable.* . Make a list of other friends, neighbors, and/or co-workers you know with infants (and small children, if applicable) with whom you can connect. . Make a list of local or online support groups, mom groups, etc. in which you can be involved. ______________________________________________________________________________________________________________________________________________________________________________________________________________________________________________________________________________________________________________________________________________________________________________________________________  Childcare Plans . Investigate and plan for childcare if mom is returning to work. . Talk about mom's concerns about her transition back to work. . Talk about partner's concerns regarding this transition.  Mental Health *Your mental health is one of the highest priorities for  a pregnant or postpartum mom.* . 1 in 5 women experience anxiety and/or depression from the time of conception through the first year after birth. . Postpartum Mood Disorders are the #1 complication of pregnancy and childbirth and the suffering experienced by these mothers is not necessary! These illnesses are temporary and respond well to treatment, which often includes self-care, social support, talk therapy, and medication when needed. . Women experiencing anxiety and depression often say things like: "I'm supposed to be happy.why do I feel so sad?", "Why can't I snap out of it?", "I'm having thoughts that scare me." . There is no need to be embarrassed if you are feeling these symptoms: o Overwhelmed, anxious, angry, sad, guilty, irritable, hopeless, exhausted but can't sleep o You are NOT alone. You are NOT to blame. With help, you WILL be well. . Where can I find help? Medical professionals such as your OB, midwife, gynecologist, family practitioner, primary care provider, pediatrician, or mental health providers; [facility] support groups: Feelings After Birth, Breastfeeding Support Group, Baby and Me Group, and Fit 4 Two exercise classes. . You have permission to ask for help. It will confirm your feelings, validate your experiences, share/learn coping strategies, and gain support and encouragement as you heal. You are important! BRAINSTORM . Make a list of local resources, including resources for mom and for partner. . Identify support groups. . Identify people to call late at night - include names and contact info. . Talk with partner about perinatal mood and anxiety disorders. . Talk with your OB, midwife, and doula about baby blues and about perinatal mood and anxiety disorders. . Talk with your pediatrician about perinatal mood and anxiety disorders.   Support & Sanity Savers   What do you really need?  . Basics . In preparing for a new baby, many expectant parents spend  hours shopping for baby clothes, decorating the nursery, and deciding which car seat to buy. Yet most don't think much about what the reality of parenting a newborn will be like, and what they need to make  it through that. So, here is the advice of experienced parents. We know you'll read this, and think "they're exaggerating, I don't really need that." Just trust [place] on these, OK? Plan for all of this, and if it turns out you don't need it, come back and teach [place] how you did it!  Satira Anis (Once baby's survival needs are met, make sure you attend to your own survival needs!) . Sleep . An average newborn sleeps 16-18 hours per day, over 6-7 sleep periods, rarely more than three hours at a time. It is normal and healthy for a newborn to wake throughout the night... but really hard on parents!! . Naps. Prioritize sleep above any responsibilities like: cleaning house, visiting friends, running errands, etc.  Sleep whenever baby sleeps. If you can't nap, at least have restful times when baby eats. The more rest you get, the more patient you will be, the more emotionally stable, and better at solving problems.  . Food . You may not have realized it would be difficult to eat when you have a newborn. Yet, when we talk to . countless new parents, they say things like "it may be 2:00 pm when I realize I haven't had breakfast yet." Or "every time we sit down to dinner, baby needs to eat, and my food gets cold, so I don't bother to eat it." . Finger food. Before your baby is born, stock up with one months' worth of food that: 1) you can eat with one hand while holding a baby, 2) doesn't need to be prepped, 3) is good hot or cold, 4) doesn't spoil when left out for a few hours, and 5) you like to eat. Think about: nuts, dried fruit, Clif bars, pretzels, jerky, gogurt, baby carrots, apples, bananas, crackers, cheez-n-crackers, string cheese, hot pockets or frozen burritos to microwave, garden burgers and breakfast  pastries to put in the toaster, yogurt drinks, etc. . Restaurant Menus. Make lists of your favorite restaurants & menu items. When family/friends want to help, you can give specific information without much thought. They can either bring you the food or send gift cards for just the right meals. Rosaura Carpenter Meals.  Take some time to make a few meals to put in the freezer ahead of time.  Easy to freeze meals can be anything such as soup, lasagna, chicken pie, or spaghetti sauce. . Set up a Meal Schedule.  Ask friends and family to sign up to bring you meals during the first few weeks of being home. (It can be passed around at baby showers!) You have no idea how helpful this will be until you are in the throes of parenting.  https://hamilton-woodard.com/ is a great website to check out. . Emotional Support . Know who to call when you're stressed out. Parenting a newborn is very challenging work. There are times when it totally overwhelms your normal coping abilities. EVERY NEW PARENT NEEDS TO HAVE A PLAN FOR WHO TO CALL WHEN THEY JUST CAN'T COPE ANY MORE. (And it has to be someone other than the baby's other parent!) Before your baby is born, come up with at least one person you can call for support - write their phone number down and post it on the refrigerator. Marland Kitchen Anxiety & Sadness. Baby blues are normal after pregnancy; however, there are more severe types of anxiety & sadness which can occur and should not be ignored.  They are always treatable, but you have to take the first step by  reaching out for help. [facility] offers a "Mom Talk" group which meets every Tuesday from 10 am - 11 am.  This group is for new moms who need support and connection after their babies are born.  Call [phone].  Marland Kitchen Really, Really Helpful (Plan for them! Make sure these happen often!!) . Physical Support with Taking Care of Yourselves . Asking friends and family. Before your baby is born, set up a schedule of people who can come  and visit and help out (or ask a friend to schedule for you). Any time someone says "let me know what I can do to help," sign them up for a day. When they get there, their job is not to take care of the baby (that's your job and your joy). Their job is to take care of you!  . Postpartum doulas. If you don't have anyone you can call on for support, look into postpartum doulas:  professionals at helping parents with caring for baby, caring for themselves, getting breastfeeding started, and helping with household tasks. www.padanc.org is a helpful website for learning about doulas in our area. . Peer Support / Parent Groups . Why: One of the greatest ideas for new parents is to be around other new parents. Parent groups give you a chance to share and listen to others who are going through the same season of life, get a sense of what is normal infant development by watching several babies learn and grow, share your stories of triumph and struggles with empathetic ears, and forgive your own mistakes when you realize all parents are learning by trial and error. . Where to find: There are many places you can meet other new parents throughout our community.  [facility] offers the following classes for new moms and their little ones:  Baby and Me (Birth to [place]) and Breastfeeding Support Group. Go to www.conehealthybaby.com or call [phone] for more information. . Time for your Relationship . It's easy to get so caught up in meeting baby's immediate needs that it's hard to find time to connect with your partner, and meet the needs of your relationship. It's also easy to forget what "quality time with your partner" actually looks like. If you take your baby on a date, you'd be amazed how much of your couple time is spent feeding the baby, diapering the baby, admiring the baby, and talking about the baby. . Dating: Try to take time for just the two of you. Babysitter tip: Sometimes when moms are  breastfeeding a newborn, they find it hard to figure out how to schedule outings around baby's unpredictable feeding schedules. Have the babysitter come for a three hour period. When she comes over, if baby has just eaten, you can leave right away, and come back in two hours. If baby hasn't fed recently, you start the date at home. Once baby gets hungry and gets a good feeding in, you can head out for the rest of your date time. . Date Nights at Home: If you can't get out, at least set aside one evening a week to prioritize your relationship: whenever baby dozes off or doesn't have any immediate needs, spend a little time focusing on each other. . Potential conflicts: The main relationship conflicts that come up for new parents are: issues related to sexuality, financial stresses, a feeling of an unfair division of household tasks, and conflicts in parenting styles. The more you can work on these issues before baby arrives, the better!  Marland Kitchen  Fun and Frills (Don't forget these. and don't feel guilty for indulging in them!) . Everyone has something in life that is a fun little treat that they do just for themselves. It may be: reading the morning paper, or going for a daily jog, or having coffee with a friend once a week, or going to a movie on Friday nights, or fine chocolates, or bubble baths, or curling up with a good book. . Unless you do fun things for yourself every now and then, it's hard to have the energy for fun with your baby. Whatever your "special" treats are, make sure you find a way to continue to indulge in them after your baby is born. These special moments can recharge you, and allow you to return to baby with a new joy   PERINATAL MOOD DISORDERS: MATERNAL MENTAL HEALTH FROM CONCEPTION THROUGH THE POSTPARTUM PERIOD   Emergency and Crisis Resources:  If you are an imminent risk to self or others, are experiencing intense personal distress, and/or have noticed significant changes in activities  of daily living, call:  . 911 . [facility]: [phone] . Mobile Crisis: 877-626-1772 . National Suicide Hotline: 1-800-273-8255 Or visit the following crisis centers: . Local Emergency Departments . [facility]: [address], [place] [phone]. Hours: 8:30AM-5PM. Insurance Accepted: Medicaid, Medicare, and Uninsured.  . [facility]  [address], [place] Mon-Friday 8am-3pm  [phone]                                                                                    Non-Crisis Resources: To identify specific providers that are covered by your insurance, contact your insurance company or local agencies: [facility]: 1-800-256-2452 [facility]: 888-581-9988 [employer]: 1-800-939-5911 Postpartum Support [employer]- [place] 1-800-944-4773                                                      Outpatient therapy and medication management providers:  [facility] [phone] Hours: 9AM-5PM  Insurance Accepted: AARP, Aetna, BCBS, Cigna, [employer], Humana, Medicare  [facility] [phone] Hours: 8AM-5PM  nsurance Accepted: All insurances EXCEPT AARP, Aetna, [employer], and Humana [facility]: [phone]             Hours: 8AM-8PM Insurance Accepted: Aetna, BCBS, Cigna, [employer], Medicaid, Medicare, Uninsured [facility]: [phone] [facility]: [phone] Hours: 8:30AM-7PM Insurance Accepted: Aetna, BCBS, Medicaid, Medicare, Tricare, United [employer] [facility]:  [phone]              Hours:9AM-5PM Insurance Accepted:  Aetna, BCBS, [facility], Medicaid, United Health Care  [facility] [phone] Hours: 9AM-5:30PM Insurance Accepted: AARP, Aetna, BCBS, Cigna, and Medicaid, Medicare, United Health Care [facility]:   [phone] Hours: 9am-5pm Insurance Accepted: BCBS; they do not accept Medicaid/Medicare [facility]: [phone] Hours: 9am-9pm Insurance Accepted: All major insurance including Medicaid and Medicare [facility]: [phone] Hours: 9AM-5:30PM Insurance   Accepted: All insurances EXCEPT Medicaid and Medicare. [facility]: [phone]                                                                       Parenting Support Groups [facility]: [phone] [facility]:  [phone] Family Support Network (support for children in the NICU and/or with special needs), [phone]                                                                   Mental Health Support Groups Mental [employer] Association: [phone]                                                                                     Online Resources: Postpartum Support International: http://www.postpartum.net/  800-944-4PPD 2Moms Supporting Moms:  www.momssupportingmoms.net     

## 2019-07-06 ENCOUNTER — Other Ambulatory Visit: Payer: Self-pay

## 2019-07-06 ENCOUNTER — Inpatient Hospital Stay (HOSPITAL_COMMUNITY)
Admission: AD | Admit: 2019-07-06 | Discharge: 2019-07-07 | Disposition: A | Discharge status: Home / Self Care | Payer: Medicaid Other | Attending: Obstetrics & Gynecology | Admitting: Obstetrics & Gynecology

## 2019-07-06 DIAGNOSIS — O368330 Maternal care for abnormalities of the fetal heart rate or rhythm, third trimester, not applicable or unspecified: Secondary | ICD-10-CM | POA: Insufficient documentation

## 2019-07-06 DIAGNOSIS — Z20822 Contact with and (suspected) exposure to covid-19: Secondary | ICD-10-CM | POA: Insufficient documentation

## 2019-07-06 DIAGNOSIS — N760 Acute vaginitis: Secondary | ICD-10-CM

## 2019-07-06 DIAGNOSIS — O23593 Infection of other part of genital tract in pregnancy, third trimester: Secondary | ICD-10-CM | POA: Insufficient documentation

## 2019-07-06 DIAGNOSIS — O288 Other abnormal findings on antenatal screening of mother: Secondary | ICD-10-CM

## 2019-07-06 DIAGNOSIS — B9689 Other specified bacterial agents as the cause of diseases classified elsewhere: Secondary | ICD-10-CM | POA: Insufficient documentation

## 2019-07-06 DIAGNOSIS — Z3A33 33 weeks gestation of pregnancy: Secondary | ICD-10-CM | POA: Insufficient documentation

## 2019-07-06 DIAGNOSIS — O0990 Supervision of high risk pregnancy, unspecified, unspecified trimester: Secondary | ICD-10-CM

## 2019-07-07 ENCOUNTER — Encounter (HOSPITAL_COMMUNITY): Payer: Self-pay | Admitting: Obstetrics & Gynecology

## 2019-07-07 ENCOUNTER — Other Ambulatory Visit: Payer: Self-pay

## 2019-07-07 ENCOUNTER — Inpatient Hospital Stay (HOSPITAL_BASED_OUTPATIENT_CLINIC_OR_DEPARTMENT_OTHER): Payer: Medicaid Other

## 2019-07-07 DIAGNOSIS — N760 Acute vaginitis: Secondary | ICD-10-CM | POA: Diagnosis not present

## 2019-07-07 DIAGNOSIS — R109 Unspecified abdominal pain: Secondary | ICD-10-CM | POA: Diagnosis not present

## 2019-07-07 DIAGNOSIS — B9689 Other specified bacterial agents as the cause of diseases classified elsewhere: Secondary | ICD-10-CM

## 2019-07-07 DIAGNOSIS — Z362 Encounter for other antenatal screening follow-up: Secondary | ICD-10-CM | POA: Diagnosis not present

## 2019-07-07 DIAGNOSIS — O288 Other abnormal findings on antenatal screening of mother: Secondary | ICD-10-CM

## 2019-07-07 DIAGNOSIS — Z3A33 33 weeks gestation of pregnancy: Secondary | ICD-10-CM

## 2019-07-07 DIAGNOSIS — O26891 Other specified pregnancy related conditions, first trimester: Secondary | ICD-10-CM | POA: Diagnosis not present

## 2019-07-07 DIAGNOSIS — O3122X2 Continuing pregnancy after intrauterine death of one fetus or more, second trimester, fetus 2: Secondary | ICD-10-CM | POA: Diagnosis not present

## 2019-07-07 DIAGNOSIS — O368330 Maternal care for abnormalities of the fetal heart rate or rhythm, third trimester, not applicable or unspecified: Secondary | ICD-10-CM | POA: Diagnosis not present

## 2019-07-07 DIAGNOSIS — O23593 Infection of other part of genital tract in pregnancy, third trimester: Secondary | ICD-10-CM | POA: Diagnosis not present

## 2019-07-07 DIAGNOSIS — Z20822 Contact with and (suspected) exposure to covid-19: Secondary | ICD-10-CM | POA: Diagnosis not present

## 2019-07-07 LAB — WET PREP, GENITAL
Sperm: NONE SEEN
Trich, Wet Prep: NONE SEEN
Yeast Wet Prep HPF POC: NONE SEEN

## 2019-07-07 LAB — RESPIRATORY PANEL BY RT PCR (FLU A&B, COVID)
Influenza A by PCR: NEGATIVE
Influenza B by PCR: NEGATIVE
SARS Coronavirus 2 by RT PCR: NEGATIVE

## 2019-07-07 LAB — URINALYSIS, ROUTINE W REFLEX MICROSCOPIC
Bacteria, UA: NONE SEEN
Bilirubin Urine: NEGATIVE
Glucose, UA: NEGATIVE mg/dL
Hgb urine dipstick: NEGATIVE
Ketones, ur: NEGATIVE mg/dL
Nitrite: NEGATIVE
Protein, ur: NEGATIVE mg/dL
Specific Gravity, Urine: 1.013 (ref 1.005–1.030)
pH: 6 (ref 5.0–8.0)

## 2019-07-07 MED ORDER — METRONIDAZOLE 500 MG PO TABS: 500.0000 mg | ORAL_TABLET | Freq: Once | ORAL | Status: DC

## 2019-07-07 MED ORDER — DEXTROSE IN LACTATED RINGERS 5 % IV SOLN: INTRAVENOUS | Status: DC

## 2019-07-07 MED ORDER — LACTATED RINGERS IV BOLUS
1000.0000 mL | Freq: Once | INTRAVENOUS | Status: AC
  Administered 2019-07-07: 1000 mL via INTRAVENOUS

## 2019-07-07 MED ORDER — METRONIDAZOLE 500 MG PO TABS: 500.0000 mg | ORAL_TABLET | Freq: Two times a day (BID) | ORAL | 0 refills | Status: DC

## 2019-07-07 NOTE — MAU Note (Signed)
Covid swab obtained without difficulty. Pt tol well. No symptoms 

## 2019-07-07 NOTE — Discharge Instructions (Signed)
Bacterial Vaginosis  Bacterial vaginosis is a vaginal infection that occurs when the normal balance of bacteria in the vagina is disrupted. It results from an overgrowth of certain bacteria. This is the most common vaginal infection among women ages 15-44. Because bacterial vaginosis increases your risk for STIs (sexually transmitted infections), getting treated can help reduce your risk for chlamydia, gonorrhea, herpes, and HIV (human immunodeficiency virus). Treatment is also important for preventing complications in pregnant women, because this condition can cause an early (premature) delivery. What are the causes? This condition is caused by an increase in harmful bacteria that are normally present in small amounts in the vagina. However, the reason that the condition develops is not fully understood. What increases the risk? The following factors may make you more likely to develop this condition:  Having a new sexual partner or multiple sexual partners.  Having unprotected sex.  Douching.  Having an intrauterine device (IUD).  Smoking.  Drug and alcohol abuse.  Taking certain antibiotic medicines.  Being pregnant. You cannot get bacterial vaginosis from toilet seats, bedding, swimming pools, or contact with objects around you. What are the signs or symptoms? Symptoms of this condition include:  Grey or white vaginal discharge. The discharge can also be watery or foamy.  A fish-like odor with discharge, especially after sexual intercourse or during menstruation.  Itching in and around the vagina.  Burning or pain with urination. Some women with bacterial vaginosis have no signs or symptoms. How is this diagnosed? This condition is diagnosed based on:  Your medical history.  A physical exam of the vagina.  Testing a sample of vaginal fluid under a microscope to look for a large amount of bad bacteria or abnormal cells. Your health care provider may use a cotton swab or  a small wooden spatula to collect the sample. How is this treated? This condition is treated with antibiotics. These may be given as a pill, a vaginal cream, or a medicine that is put into the vagina (suppository). If the condition comes back after treatment, a second round of antibiotics may be needed. Follow these instructions at home: Medicines  Take over-the-counter and prescription medicines only as told by your health care provider.  Take or use your antibiotic as told by your health care provider. Do not stop taking or using the antibiotic even if you start to feel better. General instructions  If you have a female sexual partner, tell her that you have a vaginal infection. She should see her health care provider and be treated if she has symptoms. If you have a female sexual partner, he does not need treatment.  During treatment: ? Avoid sexual activity until you finish treatment. ? Do not douche. ? Avoid alcohol as directed by your health care provider. ? Avoid breastfeeding as directed by your health care provider.  Drink enough water and fluids to keep your urine clear or pale yellow.  Keep the area around your vagina and rectum clean. ? Wash the area daily with warm water. ? Wipe yourself from front to back after using the toilet.  Keep all follow-up visits as told by your health care provider. This is important. How is this prevented?  Do not douche.  Wash the outside of your vagina with warm water only.  Use protection when having sex. This includes latex condoms and dental dams.  Limit how many sexual partners you have. To help prevent bacterial vaginosis, it is best to have sex with just one partner (  monogamous).  Make sure you and your sexual partner are tested for STIs.  Wear cotton or cotton-lined underwear.  Avoid wearing tight pants and pantyhose, especially during summer.  Limit the amount of alcohol that you drink.  Do not use any products that contain  nicotine or tobacco, such as cigarettes and e-cigarettes. If you need help quitting, ask your health care provider.  Do not use illegal drugs. Where to find more information  Centers for Disease Control and Prevention: www.cdc.gov/std  American Sexual Health Association (ASHA): www.ashastd.org  U.S. Department of Health and Human Services, Office on Women's Health: www.womenshealth.gov/ or https://www.womenshealth.gov/a-z-topics/bacterial-vaginosis Contact a health care provider if:  Your symptoms do not improve, even after treatment.  You have more discharge or pain when urinating.  You have a fever.  You have pain in your abdomen.  You have pain during sex.  You have vaginal bleeding between periods. Summary  Bacterial vaginosis is a vaginal infection that occurs when the normal balance of bacteria in the vagina is disrupted.  Because bacterial vaginosis increases your risk for STIs (sexually transmitted infections), getting treated can help reduce your risk for chlamydia, gonorrhea, herpes, and HIV (human immunodeficiency virus). Treatment is also important for preventing complications in pregnant women, because the condition can cause an early (premature) delivery.  This condition is treated with antibiotic medicines. These may be given as a pill, a vaginal cream, or a medicine that is put into the vagina (suppository). This information is not intended to replace advice given to you by your health care provider. Make sure you discuss any questions you have with your health care provider. Document Revised: 03/05/2017 Document Reviewed: 12/07/2015 Elsevier Patient Education  2020 Elsevier Inc.  

## 2019-07-07 NOTE — MAU Provider Note (Signed)
History     CSN: [id number]  Arrival date and time: 07/06/19 2304   First Provider Initiated Contact with Patient 07/07/19 0051      Chief Complaint  Patient presents with  . Contractions   Elizabeth Stuart is a 24 y.o. G1P0 at [redacted]w[redacted]d who receives care at [facility].  She presents today for Contractions.  She states the contractions started at 9pm and was "ongoing... it was like my stomach was hard and it would go from the top of my stomach down to my cervix...it didn't really let up."  Patient reports the pain was like a tightening sensation and did not have any relieving factors, but sitting intensified the pain. Patient reports the pain "was like an 8, but nothing is happening right now."  Patient reports the contractions stopped about 15 minutes ago.  Patient states she has taken easy today as "she was in the bed all day."   Patient endorses fetal movement. Patient denies vaginal concerns and sexual activity in the past 3 days.      OB History    Gravida  1   Para  0   Term  0   Preterm  0   AB  0   Living  0     SAB  0   TAB  0   Ectopic  0   Multiple  0   Live Births              Past Medical History:  Diagnosis Date  . Allergy   . Asthma   . Infection    Uti    Past Surgical History:  Procedure Laterality Date  . WISDOM TOOTH EXTRACTION      Family History  Problem Relation Age of Onset  . Diabetes Sister   . Diabetes Brother   . Asthma Brother   . Healthy Mother   . Cancer Father        colon, lung  . Hypertension Maternal Grandmother   . Diabetes Maternal Grandmother   . Arthritis Maternal Grandmother   . Hypertension Maternal Grandfather     Social History   Tobacco Use  . Smoking status: Former Smoker    Types: Cigars    Quit date: 01/06/2015    Years since quitting: 4.5  . Smokeless tobacco: Never Used  Substance Use Topics  . Alcohol use: No    Alcohol/week: 0.0 standard drinks  . Drug use: Not Currently    Types: Marijuana     Allergies:  Allergies  Allergen Reactions  . Dust Mite Extract Other (See Comments)    sneezing  . Other Hives    Allergic to Downy; Grass-sneezing, congestion, itchy throat; Apples, cucumbers-hives, TREE NUTS- ASTHMA ATTACK, POTATOES- HAND SWELLING.  . Peanuts [Peanut Oil] Other (See Comments)    Asthma attack  . Tomato     Wheezing. EARS BREAK OUT    Medications Prior to Admission  Medication Sig Dispense Refill Last Dose  . loratadine (CLARITIN) 10 MG tablet Take 10 mg by mouth at bedtime.   Past Week at Unknown time  . Prenatal Vit-Fe Fumarate-FA (PRENATAL VITAMIN PO) Take 1 tablet by mouth daily.    07/06/2019 at Unknown time  . albuterol (VENTOLIN HFA) 108 (90 Base) MCG/ACT inhaler Inhale 2 puffs into the lungs every 6 (six) hours as needed for wheezing or shortness of breath. 17 g 3   . Blood Pressure Monitor MISC For regular home bp monitoring during pregnancy 1 each 0  Review of Systems  Constitutional: Negative for chills and fever.  Eyes: Negative for visual disturbance.  Respiratory: Negative for cough and shortness of breath.   Gastrointestinal: Positive for abdominal pain. Negative for constipation, diarrhea, nausea and vomiting.  Genitourinary: Positive for pelvic pain. Negative for difficulty urinating, dysuria, vaginal bleeding and vaginal discharge.  Musculoskeletal: Negative for back pain.  Neurological: Negative for dizziness, light-headedness and headaches.   Physical Exam   Blood pressure 131/85, pulse 75, temperature 98.3 F (36.8 C), temperature source Oral, resp. rate 18, last menstrual period 11/13/2018, SpO2 98 %.  Physical Exam  Constitutional: She is oriented to person, place, and time. She appears well-developed and well-nourished.  HENT:  Head: Normocephalic and atraumatic.  Eyes: Conjunctivae are normal.  Cardiovascular: Normal rate.  Respiratory: Effort normal.  GI: Soft.  Genitourinary: Cervix exhibits no motion tenderness, no  discharge and no friability.    Vaginal discharge present.     No vaginal bleeding.  No bleeding in the vagina.    Genitourinary Comments: Speculum Exam: -Normal External Genitalia: Non tender, no apparent discharge at introitus.  -Vaginal Vault: Pink mucosa with good rugae. Moderate amt thin white frothy discharge -wet prep and fFN collected -Cervix:Pink, no lesions, cysts, or polyps.  Appears closed. No active bleeding from os-GC/CT collected -Bimanual Exam:  Closed/Thick/ Ballot. Vertex    Musculoskeletal:        General: No edema. Normal range of motion.     Cervical back: Normal range of motion.  Neurological: She is alert and oriented to person, place, and time.  Skin: Skin is warm and dry.  Psychiatric: She has a normal mood and affect. Thought content normal.    Fetal Assessment 135 bpm, Mod Var, +Variable/Prolonged Decels, +Accels Toco: No Ctx graphed  MAU Course   Results for orders placed or performed during the hospital encounter of 07/06/19 (from the past 24 hour(s))  Urinalysis, Routine w reflex microscopic     Status: Abnormal   Collection Time: 07/07/19 12:12 AM  Result Value Ref Range   Color, Urine YELLOW YELLOW   APPearance HAZY (A) CLEAR   Specific Gravity, Urine 1.013 1.005 - 1.030   pH 6.0 5.0 - 8.0   Glucose, UA NEGATIVE NEGATIVE mg/dL   Hgb urine dipstick NEGATIVE NEGATIVE   Bilirubin Urine NEGATIVE NEGATIVE   Ketones, ur NEGATIVE NEGATIVE mg/dL   Protein, ur NEGATIVE NEGATIVE mg/dL   Nitrite NEGATIVE NEGATIVE   Leukocytes,Ua MODERATE (A) NEGATIVE   RBC / HPF 0-5 0 - 5 RBC/hpf   WBC, UA 0-5 0 - 5 WBC/hpf   Bacteria, UA NONE SEEN NONE SEEN   Squamous Epithelial / LPF 6-10 0 - 5   Mucus PRESENT   Wet prep, genital     Status: Abnormal   Collection Time: 07/07/19  1:05 AM  Result Value Ref Range   Yeast Wet Prep HPF POC NONE SEEN NONE SEEN   Trich, Wet Prep NONE SEEN NONE SEEN   Clue Cells Wet Prep HPF POC PRESENT (A) NONE SEEN   WBC, Wet Prep HPF  POC MANY (A) NONE SEEN   Sperm NONE SEEN   Respiratory Panel by RT PCR (Flu A&B, Covid) - Nasopharyngeal Swab     Status: None   Collection Time: 07/07/19  2:31 AM   Specimen: Nasopharyngeal Swab  Result Value Ref Range   SARS Coronavirus 2 by RT PCR NEGATIVE NEGATIVE   Influenza A by PCR NEGATIVE NEGATIVE   Influenza B by PCR NEGATIVE NEGATIVE  MDM PE Labs: UA  Wet Prep, GC/CT EFM  Assessment and Plan  24 year old G1P0  SIUP at 33.5weeks Contractions  -Patient with 3 minute PL Decel upon provider arrival into room. -Resolution with position change.  Strip reviewed and overall reassuring. -POC reviewed. -Exam performed and findings discussed. -Cultures collected and pending.  -Informed that due to decel would observe for at least two hours for fetal assurance.  Also informed that [place] may be performed as well. -Discussed that since cervix is long and closed, no fFN will be sent. -Patient verbalized understanding and without questions or concerns. -Will await results.    Cherre Robins MSN, CNM 07/07/2019, 12:51 AM   Reassessment (2:14 AM)  -LR infusion started at 0145 as patient having recurrent decels despite position changes. -Review of strip shows variable decels every 15 mins with decrease in variability. -Nurse instructed to collect labs and Covid for admission.  -Dr. Lonia Farber consulted and presented with patient status and recommendation for overnight observation.  Agrees and advises: *Change LR to D5LR *Obtain BPP *Notify if BPP </= 4/8 -Provider to bedside and patient and mother updated on POC.  Mother questions "how worried should I be?"  Reassurances given and possible outcomes discussed including at best overnight observation and at of most concern need for delivery imminently.  -Patient questions what mode of delivery would be if infant not showing reassurance and informed that MD would discuss best POC at that time if it is of necessity to  discuss. -Patient verbalizes understanding and without further questions or concerns.  -Will await BPP results.   Reassessment (3:57 AM) BPP 6/8 AFI 9.29  -BPP 6/8 for overall 6/10. -Cat I FT over initiation of D5.  -130 bpm, Mod Var, -Decels, +10x10 Accels -No beds on L&D. Will hold and observe in MAU.   -Plan to reassess and consult at 0800 or as necessary.  Reassessment (8:12 AM) Bacterial Vaginosis  -Dr. Lonia Farber consulted and strip reviewed. -Agrees with discharge to home with follow up as scheduled. -Patient informed of POC and without questions or concerns. -Next appt in office on April 7th, 2021. -Reviewed BV diagnosis and treatment. -Rx for metronidazole sent to pharmacy on file.  -Encouraged to call or return to MAU if symptoms worsen or with the onset of new symptoms. -Discharged to home in stable condition.  Cherre Robins MSN, CNM Advanced Practice Provider, Center for [employer]

## 2019-07-07 NOTE — MAU Note (Signed)
Contractions started at 9 pm, every 5-10 min, more noticeable with activity like sitting up.  No bleeding. No leaking. Baby moving well.

## 2019-07-07 NOTE — Progress Notes (Signed)
Pt was taken off the monitor at 0828. No contractions noted.

## 2019-07-10 ENCOUNTER — Telehealth (HOSPITAL_COMMUNITY): Payer: Self-pay

## 2019-07-10 DIAGNOSIS — A568 Sexually transmitted chlamydial infection of other sites: Secondary | ICD-10-CM

## 2019-07-10 LAB — GC/CHLAMYDIA PROBE AMP (~~LOC~~) NOT AT ARMC
Chlamydia: POSITIVE — AB
Comment: NEGATIVE
Comment: NORMAL
Neisseria Gonorrhea: NEGATIVE

## 2019-07-10 MED ORDER — AZITHROMYCIN 500 MG PO TABS: 1000.0000 mg | ORAL_TABLET | Freq: Once | ORAL | 0 refills | Status: AC

## 2019-07-10 NOTE — Telephone Encounter (Signed)
Elizabeth Stuart [id number] 1995-08-02  Patient called and verified her identity via birth date and last 4 of her SSN.  Patient agreeable to results via phone and was informed of positive CT.  Reviewed need for treatment and test of cure in 3-4 weeks.  Rx for Zithromax sent to pharmacy on file and patient confirms.  Patient informed of need to abstain from sexual activity for at least 14 days after partner treated.  Patient without questions or concerns.  Informed that official BPP reviewed and recommendation for repeat BPP this week.  Further informed that [facility] office notified and should be contacting about scheduling accordingly.    Cherre Robins MSN, CNM Advanced Practice Provider, Center for [facility]   **This visit was completed, in its entirety, via telehealth communications.  I personally spent >/=3 minutes on the phone providing recommendations, education, and guidance.**

## 2019-07-11 ENCOUNTER — Other Ambulatory Visit: Payer: Self-pay | Admitting: Obstetrics and Gynecology

## 2019-07-11 DIAGNOSIS — O021 Missed abortion: Secondary | ICD-10-CM

## 2019-07-11 DIAGNOSIS — Z362 Encounter for other antenatal screening follow-up: Secondary | ICD-10-CM

## 2019-07-12 ENCOUNTER — Other Ambulatory Visit: Payer: Self-pay

## 2019-07-12 ENCOUNTER — Ambulatory Visit (INDEPENDENT_AMBULATORY_CARE_PROVIDER_SITE_OTHER): Payer: Medicaid Other

## 2019-07-12 ENCOUNTER — Encounter: Payer: Medicaid Other | Admitting: Advanced Practice Midwife

## 2019-07-12 ENCOUNTER — Ambulatory Visit (INDEPENDENT_AMBULATORY_CARE_PROVIDER_SITE_OTHER): Payer: Medicaid Other | Admitting: Obstetrics and Gynecology

## 2019-07-12 VITALS — BP 137/91 | HR 96 | Wt 257.0 lb

## 2019-07-12 DIAGNOSIS — Z331 Pregnant state, incidental: Secondary | ICD-10-CM

## 2019-07-12 DIAGNOSIS — Z3A34 34 weeks gestation of pregnancy: Secondary | ICD-10-CM

## 2019-07-12 DIAGNOSIS — O021 Missed abortion: Secondary | ICD-10-CM

## 2019-07-12 DIAGNOSIS — Z362 Encounter for other antenatal screening follow-up: Secondary | ICD-10-CM | POA: Diagnosis not present

## 2019-07-12 DIAGNOSIS — O30103 Triplet pregnancy, unspecified number of placenta and unspecified number of amniotic sacs, third trimester: Secondary | ICD-10-CM | POA: Diagnosis not present

## 2019-07-12 DIAGNOSIS — O30133 Triplet pregnancy, trichorionic/triamniotic, third trimester: Secondary | ICD-10-CM

## 2019-07-12 DIAGNOSIS — Z1389 Encounter for screening for other disorder: Secondary | ICD-10-CM

## 2019-07-12 LAB — POCT URINALYSIS DIPSTICK OB
Blood, UA: NEGATIVE
Glucose, UA: NEGATIVE
Ketones, UA: NEGATIVE
Leukocytes, UA: NEGATIVE
Nitrite, UA: NEGATIVE
POC,PROTEIN,UA: NEGATIVE

## 2019-07-12 NOTE — Progress Notes (Signed)
[place] 34+3 wks,cephalic,posterior placenta gr 3,fhr 144 bpm,afi 11 cm,BPP 8/8

## 2019-07-12 NOTE — Progress Notes (Signed)
Patient ID: [name], female   DOB: January 31, 1996, 24 y.o.   MRN: [id number]     [facility] PREGNANCY VISIT Patient name: Elizabeth Stuart MRN [id number]  Date of birth: [birth year] Chief Complaint:   Routine Prenatal Visit (Ultrasound) recently tx'd for [facility] AND will need POC soon. History of Present Illness:   Elizabeth Stuart is a 24 y.o. G15P0000 female at [redacted]w[redacted]d with an Estimated Date of Delivery: 08/20/19 being seen today for ongoing management of a high-risk pregnancy complicated by spontaneous triplets with loss of two fetuses, one at 8 weeks and one at 11 weeks.  Today she reports no complaints.   She began having some contractions on 07/07/2019 at 9:00 PM every 5 to 10 minutes. She denied bleeding or leaking. She endorsed fetal movement.   Depression screen [facility] 2/9 06/05/2019 02/17/2019  Decreased Interest 1 1  Down, Depressed, Hopeless 0 1  PHQ - 2 Score 1 2  Altered sleeping 0 0  Tired, decreased energy 0 0  Change in appetite 0 0  Feeling bad or failure about yourself  0 0  Trouble concentrating 0 0  Moving slowly or fidgety/restless 0 0  Suicidal thoughts 0 0  PHQ-9 Score 1 2  Difficult doing work/chores - Not difficult at all    Contractions: Not present. Vag. Bleeding: None.  Movement: Present. denies leaking of fluid.   Review of Systems:   Pertinent items are noted in HPI Denies abnormal vaginal discharge w/ itching/odor/irritation, headaches, visual changes, shortness of breath, chest pain, abdominal pain, severe nausea/vomiting, or problems with urination or bowel movements unless otherwise stated above.  Pertinent History Reviewed:  Reviewed past medical,surgical, social, obstetrical and family history.  Reviewed problem list, medications and allergies.  Physical Assessment:    Physical Examination:   General appearance: alert, well appearing, and in no distress, oriented to person, place, and time, overweight and well hydrated  Mental status: alert, oriented to  person, place, and time, normal mood, behavior, speech, dress, motor activity, and thought processes, affect appropriate to mood  Skin: warm & dry   Extremities: Edema: Trace    Cardiovascular: normal heart rate noted  Respiratory: normal respiratory effort, no distress  Abdomen: gravid, soft, non-tender   35 cm  Pelvic: Cervical exam deferred         Fetal Status:     Movement: Present      Fetal Surveillance Testing today: BPP 8/8  FOLLOW UP SONOGRAM   Elizabeth Stuart is in the office for a follow up sonogram for BPP.  She is a 24 y.o. year old G1P0000 with Estimated Date of Delivery: 08/20/19 by LMP now at  [redacted]w[redacted]d weeks gestation. Thus far the pregnancy has been complicated by triplets with fetal demise baby A and C.  TECHNICIAN COMMENTS:  [place] 34+3 wks,cephalic,posterior placenta gr 3,fhr 144 bpm,afi 11 cm,BPP 8/8  A copy of this report including all images has been saved and backed up to a second source for retrieval if needed. All measures and details of the anatomical scan, placentation, fluid volume and pelvic anatomy are contained in that report.  Amber Flora Lipps 07/12/2019 2:53 PM    Chaperone: Mal Misty    Results for orders placed or performed in visit on 07/12/19 (from the past 24 hour(s))  POC Urinalysis Dipstick OB   Collection Time: 07/12/19  3:00 PM  Result Value Ref Range   Color, UA     Clarity, UA     Glucose, UA Negative  Negative   Bilirubin, UA     Ketones, UA n    Spec Grav, UA     Blood, UA n    pH, UA     POC,PROTEIN,UA Negative Negative, Trace, Small (1+), Moderate (2+), Large (3+), 4+   Urobilinogen, UA     Nitrite, UA n    Leukocytes, UA Negative Negative   Appearance     Odor      Assessment & Plan:  1) High-risk pregnancy G1P0000 at [redacted]w[redacted]d with an Estimated Date of Delivery: 08/20/19 due to hx natural triplets, now with singleton after losses as 8, 11 wk. NOW with ELEVATED BLOOD PRESSURE x 1 visit  2) Appointment for BPP on 07/14/2019, bp  check again  3) Return in 1 week for NST  Meds: No orders of the defined types were placed in this encounter.   Labs/procedures today: BPP [place] 16+1 wks, cephalic, posterior placenta gr 3, fhr 144 bpm, afi 11 cm, BPP 8/8  Treatment Plan:  Followup POC of Trich in 2 wk.                              Has bpp at [facility] this Friday.                             NST next week                              Has plans for / waterbirth, will meet with midwife, who will review if pt meets waterbirth safety criteria.  Reviewed:  labor symptoms and general obstetric precautions including but not limited to vaginal bleeding, contractions, leaking of fluid and fetal movement were reviewed in detail with the patient.  All questions were answered. has home bp cuff.. Check bp daily, let us know if >096 systolic or 90.diastolic.   Follow-up: Has BPP Friday at [facility] per pt report.                    Will have office call pt for bp check in am, consider baseline cbc, cmet in a.m.  Orders Placed This Encounter  Procedures  . POC Urinalysis Dipstick OB    By signing my name below, I, [employer], attest that this documentation has been prepared under the direction and in the presence of Myrtis Ser, CNM. Electronically Signed: Social research officer, government. 07/12/19. 3:14 PM.  I personally performed the services described in this documentation, which was SCRIBED in my presence. The recorded information has been reviewed and considered accurate. It has been edited as necessary during review. Jonnie Kind, MD

## 2019-07-13 ENCOUNTER — Telehealth: Payer: Self-pay | Admitting: *Deleted

## 2019-07-13 NOTE — Telephone Encounter (Signed)
-----   Message from Tilda Burrow, MD sent at 07/12/2019  8:58 PM EDT ----- Regarding: elevated pressures Elevated pressures , noted on completion of chart tonight. Please call pt in a.m., see what her home bp was.  If elevated, needs PIH labs  Please confirm pt has BPP Friday at [facility].

## 2019-07-13 NOTE — Telephone Encounter (Signed)
Called to f/u with patient regarding Bp readings at home. Pt states they have been ranging in the 130's/80's. She has not been at home today so she has not been able to check it.  Denies headaches, vision changes.  Requested patient check her BP tonight when she gets home and send reading to [place] in [place]. Advised if BP is 140/90, will need PIH labs drawn.  Pt verbalized understanding and stated she would. Message sent to [facility] to f/u with patient in am.

## 2019-07-14 ENCOUNTER — Other Ambulatory Visit: Payer: Self-pay | Admitting: *Deleted

## 2019-07-14 ENCOUNTER — Other Ambulatory Visit: Payer: Self-pay

## 2019-07-14 ENCOUNTER — Other Ambulatory Visit (HOSPITAL_COMMUNITY): Payer: Self-pay | Admitting: Obstetrics and Gynecology

## 2019-07-14 ENCOUNTER — Ambulatory Visit (HOSPITAL_COMMUNITY): Payer: Medicaid Other | Admitting: *Deleted

## 2019-07-14 ENCOUNTER — Encounter (HOSPITAL_COMMUNITY): Payer: Self-pay

## 2019-07-14 ENCOUNTER — Ambulatory Visit (HOSPITAL_COMMUNITY)
Admission: RE | Admit: 2019-07-14 | Discharge: 2019-07-14 | Disposition: A | Discharge status: Home / Self Care | Payer: Medicaid Other | Source: Ambulatory Visit | Attending: Obstetrics and Gynecology | Admitting: Obstetrics and Gynecology

## 2019-07-14 ENCOUNTER — Other Ambulatory Visit (HOSPITAL_COMMUNITY): Payer: Self-pay | Admitting: *Deleted

## 2019-07-14 DIAGNOSIS — O021 Missed abortion: Secondary | ICD-10-CM

## 2019-07-14 DIAGNOSIS — O365990 Maternal care for other known or suspected poor fetal growth, unspecified trimester, not applicable or unspecified: Secondary | ICD-10-CM

## 2019-07-14 DIAGNOSIS — O30109 Triplet pregnancy, unspecified number of placenta and unspecified number of amniotic sacs, unspecified trimester: Secondary | ICD-10-CM | POA: Diagnosis not present

## 2019-07-14 DIAGNOSIS — O0990 Supervision of high risk pregnancy, unspecified, unspecified trimester: Secondary | ICD-10-CM

## 2019-07-14 DIAGNOSIS — O365930 Maternal care for other known or suspected poor fetal growth, third trimester, not applicable or unspecified: Secondary | ICD-10-CM | POA: Diagnosis not present

## 2019-07-14 DIAGNOSIS — Z362 Encounter for other antenatal screening follow-up: Secondary | ICD-10-CM

## 2019-07-14 DIAGNOSIS — O163 Unspecified maternal hypertension, third trimester: Secondary | ICD-10-CM

## 2019-07-14 DIAGNOSIS — Z3A34 34 weeks gestation of pregnancy: Secondary | ICD-10-CM

## 2019-07-15 LAB — PROTEIN / CREATININE RATIO, URINE
Creatinine, Urine: 61.8 mg/dL
Protein, Ur: 13.6 mg/dL
Protein/Creat Ratio: 220 mg/g creat — ABNORMAL HIGH (ref 0–200)

## 2019-07-15 LAB — COMPREHENSIVE METABOLIC PANEL
ALT: 12 IU/L (ref 0–32)
AST: 17 IU/L (ref 0–40)
Albumin/Globulin Ratio: 1.4 (ref 1.2–2.2)
Albumin: 3.8 g/dL — ABNORMAL LOW (ref 3.9–5.0)
Alkaline Phosphatase: 87 IU/L (ref 39–117)
BUN/Creatinine Ratio: 11 (ref 9–23)
BUN: 8 mg/dL (ref 6–20)
Bilirubin Total: <0.2 mg/dL (ref 0.0–1.2)
CO2: 16 mmol/L — ABNORMAL LOW (ref 20–29)
Calcium: 9.1 mg/dL (ref 8.7–10.2)
Chloride: 104 mmol/L (ref 96–106)
Creatinine, Ser: 0.7 mg/dL (ref 0.57–1.00)
GFR calc Af Amer: 142 mL/min/{1.73_m2} (ref >59)
GFR calc non Af Amer: 123 mL/min/{1.73_m2} (ref >59)
Globulin, Total: 2.7 g/dL (ref 1.5–4.5)
Glucose: 109 mg/dL — ABNORMAL HIGH (ref 65–99)
Potassium: 4.1 mmol/L (ref 3.5–5.2)
Sodium: 136 mmol/L (ref 134–144)
Total Protein: 6.5 g/dL (ref 6.0–8.5)

## 2019-07-15 NOTE — Telephone Encounter (Signed)
Phone call to [place] reviewing the elevated Dopplers with S/D ratios at 97.5% for gestational age yesterday at [facility] and subsequent recommendations for delivery at 37 wks if dopplers remain with no evidence of AEDF or REDF.  Pt understands the concern over placental function and would be candidate for delivery at 4/25 . Pt has weekly dopplers on fridays at [facility], and will have BPP or NST earlier in week at [facility].  Will move appt to Tuesday for improved spacing of antenatal testing.

## 2019-07-17 ENCOUNTER — Other Ambulatory Visit: Payer: Self-pay | Admitting: Obstetrics and Gynecology

## 2019-07-17 DIAGNOSIS — IMO0002 Reserved for concepts with insufficient information to code with codable children: Secondary | ICD-10-CM

## 2019-07-18 ENCOUNTER — Ambulatory Visit (INDEPENDENT_AMBULATORY_CARE_PROVIDER_SITE_OTHER): Payer: Medicaid Other

## 2019-07-18 ENCOUNTER — Encounter (HOSPITAL_COMMUNITY): Payer: Self-pay | Admitting: Obstetrics & Gynecology

## 2019-07-18 ENCOUNTER — Inpatient Hospital Stay (HOSPITAL_COMMUNITY)
Admission: AD | Admit: 2019-07-18 | Discharge: 2019-07-21 | DRG: 806 | Disposition: A | Discharge status: Home / Self Care | Payer: Medicaid Other | Attending: Family Medicine | Admitting: Family Medicine

## 2019-07-18 ENCOUNTER — Ambulatory Visit (INDEPENDENT_AMBULATORY_CARE_PROVIDER_SITE_OTHER): Payer: Medicaid Other | Admitting: Obstetrics & Gynecology

## 2019-07-18 ENCOUNTER — Other Ambulatory Visit (HOSPITAL_COMMUNITY)
Admission: RE | Admit: 2019-07-18 | Discharge: 2019-07-18 | Disposition: A | Discharge status: Home / Self Care | Payer: Medicaid Other | Source: Ambulatory Visit | Attending: Obstetrics & Gynecology | Admitting: Obstetrics & Gynecology

## 2019-07-18 ENCOUNTER — Other Ambulatory Visit: Payer: Self-pay

## 2019-07-18 VITALS — BP 136/91 | HR 95 | Wt 256.0 lb

## 2019-07-18 DIAGNOSIS — Z3A35 35 weeks gestation of pregnancy: Secondary | ICD-10-CM

## 2019-07-18 DIAGNOSIS — O134 Gestational [pregnancy-induced] hypertension without significant proteinuria, complicating childbirth: Secondary | ICD-10-CM | POA: Diagnosis present

## 2019-07-18 DIAGNOSIS — R825 Elevated urine levels of drugs, medicaments and biological substances: Secondary | ICD-10-CM | POA: Diagnosis present

## 2019-07-18 DIAGNOSIS — O365931 Maternal care for other known or suspected poor fetal growth, third trimester, fetus 1: Secondary | ICD-10-CM | POA: Diagnosis not present

## 2019-07-18 DIAGNOSIS — Z20822 Contact with and (suspected) exposure to covid-19: Secondary | ICD-10-CM | POA: Diagnosis present

## 2019-07-18 DIAGNOSIS — O99214 Obesity complicating childbirth: Secondary | ICD-10-CM | POA: Diagnosis present

## 2019-07-18 DIAGNOSIS — O30133 Triplet pregnancy, trichorionic/triamniotic, third trimester: Secondary | ICD-10-CM | POA: Diagnosis not present

## 2019-07-18 DIAGNOSIS — J4520 Mild intermittent asthma, uncomplicated: Secondary | ICD-10-CM | POA: Diagnosis present

## 2019-07-18 DIAGNOSIS — O365933 Maternal care for other known or suspected poor fetal growth, third trimester, fetus 3: Secondary | ICD-10-CM

## 2019-07-18 DIAGNOSIS — O9832 Other infections with a predominantly sexual mode of transmission complicating childbirth: Secondary | ICD-10-CM | POA: Diagnosis present

## 2019-07-18 DIAGNOSIS — Z87891 Personal history of nicotine dependence: Secondary | ICD-10-CM | POA: Diagnosis not present

## 2019-07-18 DIAGNOSIS — A568 Sexually transmitted chlamydial infection of other sites: Secondary | ICD-10-CM | POA: Diagnosis present

## 2019-07-18 DIAGNOSIS — IMO0002 Reserved for concepts with insufficient information to code with codable children: Secondary | ICD-10-CM

## 2019-07-18 DIAGNOSIS — Z8759 Personal history of other complications of pregnancy, childbirth and the puerperium: Secondary | ICD-10-CM | POA: Diagnosis present

## 2019-07-18 DIAGNOSIS — O365930 Maternal care for other known or suspected poor fetal growth, third trimester, not applicable or unspecified: Principal | ICD-10-CM | POA: Diagnosis present

## 2019-07-18 DIAGNOSIS — O9952 Diseases of the respiratory system complicating childbirth: Secondary | ICD-10-CM | POA: Diagnosis present

## 2019-07-18 DIAGNOSIS — O368930 Maternal care for other specified fetal problems, third trimester, not applicable or unspecified: Secondary | ICD-10-CM | POA: Diagnosis not present

## 2019-07-18 DIAGNOSIS — O0990 Supervision of high risk pregnancy, unspecified, unspecified trimester: Secondary | ICD-10-CM | POA: Insufficient documentation

## 2019-07-18 DIAGNOSIS — O3090 Multiple gestation, unspecified, unspecified trimester: Secondary | ICD-10-CM | POA: Diagnosis present

## 2019-07-18 DIAGNOSIS — O139 Gestational [pregnancy-induced] hypertension without significant proteinuria, unspecified trimester: Secondary | ICD-10-CM | POA: Diagnosis present

## 2019-07-18 LAB — COMPREHENSIVE METABOLIC PANEL
ALT: 16 U/L (ref 0–44)
AST: 17 U/L (ref 15–41)
Albumin: 3.1 g/dL — ABNORMAL LOW (ref 3.5–5.0)
Alkaline Phosphatase: 81 U/L (ref 38–126)
Anion gap: 10 (ref 5–15)
BUN: 9 mg/dL (ref 6–20)
CO2: 19 mmol/L — ABNORMAL LOW (ref 22–32)
Calcium: 9.4 mg/dL (ref 8.9–10.3)
Chloride: 108 mmol/L (ref 98–111)
Creatinine, Ser: 0.74 mg/dL (ref 0.44–1.00)
GFR calc Af Amer: >60 mL/min (ref >60)
GFR calc non Af Amer: >60 mL/min (ref >60)
Glucose, Bld: 106 mg/dL — ABNORMAL HIGH (ref 70–99)
Potassium: 4.4 mmol/L (ref 3.5–5.1)
Sodium: 137 mmol/L (ref 135–145)
Total Bilirubin: 0.5 mg/dL (ref 0.3–1.2)
Total Protein: 6.9 g/dL (ref 6.5–8.1)

## 2019-07-18 LAB — PROTEIN / CREATININE RATIO, URINE
Creatinine, Urine: 103.24 mg/dL
Protein Creatinine Ratio: 0.16 mg/mg{Cre} — ABNORMAL HIGH (ref 0.00–0.15)
Total Protein, Urine: 17 mg/dL

## 2019-07-18 LAB — CBC
HCT: 34.9 % — ABNORMAL LOW (ref 36.0–46.0)
Hemoglobin: 11.8 g/dL — ABNORMAL LOW (ref 12.0–15.0)
MCH: 28.2 pg (ref 26.0–34.0)
MCHC: 33.8 g/dL (ref 30.0–36.0)
MCV: 83.3 fL (ref 80.0–100.0)
Platelets: 272 10*3/uL (ref 150–400)
RBC: 4.19 MIL/uL (ref 3.87–5.11)
RDW: 15.4 % (ref 11.5–15.5)
WBC: 7.9 10*3/uL (ref 4.0–10.5)
nRBC: 0 % (ref 0.0–0.2)

## 2019-07-18 LAB — SARS CORONAVIRUS 2 (TAT 6-24 HRS): SARS Coronavirus 2: NEGATIVE

## 2019-07-18 LAB — TYPE AND SCREEN
ABO/RH(D): B POS
Antibody Screen: NEGATIVE

## 2019-07-18 LAB — ABO/RH: ABO/RH(D): B POS

## 2019-07-18 MED ORDER — SODIUM CHLORIDE 0.9 % IV SOLN
5.0000 10*6.[IU] | Freq: Once | INTRAVENOUS | Status: AC
  Administered 2019-07-18: 5 10*6.[IU] via INTRAVENOUS
  Filled 2019-07-18: qty 5

## 2019-07-18 MED ORDER — OXYCODONE-ACETAMINOPHEN 5-325 MG PO TABS: 2.0000 | ORAL_TABLET | ORAL | Status: DC | PRN

## 2019-07-18 MED ORDER — ACETAMINOPHEN 325 MG PO TABS: 650.0000 mg | ORAL_TABLET | ORAL | Status: DC | PRN

## 2019-07-18 MED ORDER — EPHEDRINE 5 MG/ML INJ: 10.0000 mg | INTRAVENOUS | Status: DC | PRN

## 2019-07-18 MED ORDER — LACTATED RINGERS IV SOLN
500.0000 mL | INTRAVENOUS | Status: DC | PRN
  Administered 2019-07-19: 500 mL via INTRAVENOUS

## 2019-07-18 MED ORDER — SOD CITRATE-CITRIC ACID 500-334 MG/5ML PO SOLN
30.0000 mL | ORAL | Status: DC | PRN
  Administered 2019-07-18: 30 mL via ORAL
  Filled 2019-07-18: qty 30

## 2019-07-18 MED ORDER — FENTANYL CITRATE (PF) 100 MCG/2ML IJ SOLN: 50.0000 ug | INTRAMUSCULAR | Status: DC | PRN

## 2019-07-18 MED ORDER — ONDANSETRON HCL 4 MG/2ML IJ SOLN: 4.0000 mg | Freq: Four times a day (QID) | INTRAMUSCULAR | Status: DC | PRN

## 2019-07-18 MED ORDER — LACTATED RINGERS IV SOLN: 500.0000 mL | Freq: Once | INTRAVENOUS | Status: DC

## 2019-07-18 MED ORDER — OXYTOCIN BOLUS FROM INFUSION
500.0000 mL | Freq: Once | INTRAVENOUS | Status: AC
  Administered 2019-07-19: 14:00:00 500 mL via INTRAVENOUS

## 2019-07-18 MED ORDER — OXYTOCIN 40 UNITS IN NORMAL SALINE INFUSION - SIMPLE MED: 2.5000 [IU]/h | INTRAVENOUS | Status: DC

## 2019-07-18 MED ORDER — PHENYLEPHRINE 40 MCG/ML (10ML) SYRINGE FOR IV PUSH (FOR BLOOD PRESSURE SUPPORT): 80.0000 ug | PREFILLED_SYRINGE | INTRAVENOUS | Status: DC | PRN

## 2019-07-18 MED ORDER — MISOPROSTOL 25 MCG QUARTER TABLET: 25.0000 ug | ORAL_TABLET | ORAL | Status: DC | PRN

## 2019-07-18 MED ORDER — BETAMETHASONE SOD PHOS & ACET 6 (3-3) MG/ML IJ SUSP
12.0000 mg | Freq: Once | INTRAMUSCULAR | Status: AC
  Administered 2019-07-19: 12 mg via INTRAMUSCULAR
  Filled 2019-07-18: qty 5

## 2019-07-18 MED ORDER — LIDOCAINE HCL (PF) 1 % IJ SOLN: 30.0000 mL | INTRAMUSCULAR | Status: DC | PRN

## 2019-07-18 MED ORDER — DIPHENHYDRAMINE HCL 50 MG/ML IJ SOLN: 12.5000 mg | INTRAMUSCULAR | Status: DC | PRN

## 2019-07-18 MED ORDER — TERBUTALINE SULFATE 1 MG/ML IJ SOLN: 0.2500 mg | Freq: Once | INTRAMUSCULAR | Status: DC | PRN

## 2019-07-18 MED ORDER — BETAMETHASONE SOD PHOS & ACET 6 (3-3) MG/ML IJ SUSP
12.0000 mg | INTRAMUSCULAR | Status: DC
  Administered 2019-07-18: 12 mg via INTRAMUSCULAR

## 2019-07-18 MED ORDER — LACTATED RINGERS IV SOLN: INTRAVENOUS | Status: DC

## 2019-07-18 MED ORDER — PENICILLIN G POT IN DEXTROSE 60000 UNIT/ML IV SOLN
3.0000 10*6.[IU] | INTRAVENOUS | Status: DC
  Administered 2019-07-18 – 2019-07-19 (×5): 3 10*6.[IU] via INTRAVENOUS
  Filled 2019-07-18 (×5): qty 50

## 2019-07-18 MED ORDER — FENTANYL-BUPIVACAINE-NACL 0.5-0.125-0.9 MG/250ML-% EP SOLN
12.0000 mL/h | EPIDURAL | Status: DC | PRN
  Filled 2019-07-18: qty 250

## 2019-07-18 MED ORDER — OXYCODONE-ACETAMINOPHEN 5-325 MG PO TABS: 1.0000 | ORAL_TABLET | ORAL | Status: DC | PRN

## 2019-07-18 MED ORDER — OXYTOCIN 40 UNITS IN NORMAL SALINE INFUSION - SIMPLE MED
1.0000 m[IU]/min | INTRAVENOUS | Status: DC
  Administered 2019-07-18: 21:00:00 2 m[IU]/min via INTRAVENOUS
  Filled 2019-07-18: qty 1000

## 2019-07-18 NOTE — Progress Notes (Signed)
[place] PREGNANCY VISIT Patient name: Elizabeth Stuart MRN [id number]  Date of birth: [birth year] Chief Complaint:   Routine Prenatal Visit ([place] today)  History of Present Illness:   Elizabeth Stuart is a 24 y.o. G40P0000 female at [redacted]w[redacted]d with an Estimated Date of Delivery: 08/20/19 being seen today for ongoing management of a high-risk pregnancy complicated by loss of 2 fetuses in the first trimester of a spontaneous triplet pregnancy,  fetal growth restriction of remaining fetus @ 5% with elevated fetal Dopplers >90%tile, today with intermittent absent end diastolic flow Today she reports no complaints.  Depression screen [facility] 2/9 06/05/2019 02/17/2019  Decreased Interest 1 1  Down, Depressed, Hopeless 0 1  PHQ - 2 Score 1 2  Altered sleeping 0 0  Tired, decreased energy 0 0  Change in appetite 0 0  Feeling bad or failure about yourself  0 0  Trouble concentrating 0 0  Moving slowly or fidgety/restless 0 0  Suicidal thoughts 0 0  PHQ-9 Score 1 2  Difficult doing work/chores - Not difficult at all    Contractions: Not present. Vag. Bleeding: None.  Movement: Present. denies leaking of fluid.  Review of Systems:   Pertinent items are noted in HPI Denies abnormal vaginal discharge w/ itching/odor/irritation, headaches, visual changes, shortness of breath, chest pain, abdominal pain, severe nausea/vomiting, or problems with urination or bowel movements unless otherwise stated above. Pertinent History Reviewed:  Reviewed past medical,surgical, social, obstetrical and family history.  Reviewed problem list, medications and allergies. Physical Assessment:   Vitals:   07/18/19 1048  BP: (!) 136/91  Pulse: 95  Weight: 256 lb (116.1 kg)  Body mass index is 46.82 kg/m.           Physical Examination:   General appearance: alert, well appearing, and in no distress  Mental status: alert, oriented to person, place, and time  Skin: warm & dry   Extremities: Edema: Trace    Cardiovascular: normal  heart rate noted  Respiratory: normal respiratory effort, no distress  Abdomen: gravid, soft, non-tender  Pelvic: Cervical exam performed         Fetal Status:     Movement: Present    Fetal Surveillance Testing today: BPP 8/8 with intermittent absent end diastolic flow   Chaperone: Amanda Rash    No results found for this or any previous visit (from the past 24 hour(s)).  Assessment & Plan:  1) High-risk pregnancy G1P0000 at [redacted]w[redacted]d with an Estimated Date of Delivery: 08/20/19   2) FGR in a surviving spontaneous triplet with elevated fetal Dopplers, today there is intermittent absent end diastolic flow  3) Borderline BP, not quite diagnostic for GHTN, stable, will check pre eclampsia labs as a baseline  Meds:  Meds ordered this encounter  Medications  . betamethasone acetate-betamethasone sodium phosphate (CELESTONE) injection 12 mg    Labs/procedures today: sonogram, cultures done GBS  Treatment Plan:  Per sonogram report, to L&D for IOL for FGR with new development of intermittent EDF, BMZ given repeat tomorrow if undelivered  D/W DR Gertie Exon, MFM,  who agrees with management plan  Reviewed: Preterm labor symptoms and general obstetric precautions including but not limited to vaginal bleeding, contractions, leaking of fluid and fetal movement were reviewed in detail with the patient.  All questions were answered.  home bp cuff. Rx faxed to . Check bp weekly, let us know if >140/90.   Follow-up: Return in about 6 weeks (around 08/29/2019) for post partum visit.  No orders  of the defined types were placed in this encounter.  Lazaro Arms  07/18/2019 11:56 AM

## 2019-07-18 NOTE — Progress Notes (Signed)
[place] 35+2 wks,cephalic,BPP 8/8,elevated UAD with intermittent absent end diastolic flow,RI  .82,.89,.89=99.97%,FHR 174 BPM

## 2019-07-18 NOTE — Consult Note (Signed)
Neonatology Consult  Note:  At the request of the patients obstetrician Dr. Dione Plover I met with Elizabeth Stuart who is a 24 y.o. female G1P0000 with IUP at 58w2dby 6wk [place] for I[place] secondary to IUGR 5% with intermittent AEDF.   Pregnancy complications:  - natural triplets with demise of fetus A at 832 weeksand C at 149 wks- IUGR at 5%lie - intermittent absent end diastolic flow  - gHTN  - Chalmydia: +07/07/2019, treatment sent 07/10/2019, reports she took treatment. TOC in 2-3 weeks  We reviewed IUGR status and likely need for NICU care with glucose, temperature and feeding support.  Also discussed low but possible need for respiratory support due to late term prematurity.  We discussed feeding immaturity and need for full po intake with multiple days of good weight gain and no apnea or bradycardia before discharge. Discussed likely length of stay.  Thank you for allowing [place] participate in her care.  Please call with questions.  BHiginio Roger DO  Neonatologist  The total length of face-to-face or floor / unit time for this encounter was 20 minutes.  Counseling and / or coordination of care was greater than fifty percent of the time.

## 2019-07-18 NOTE — Progress Notes (Signed)
Labor Progress Note Elizabeth Stuart is a 24 y.o. G1P0000 at 12w2dpresented for IOL due to FGR and iAEDF. S: Feeling some cramping in back. Met patient and discussed plan.   O:  BP (!) 148/97   Pulse 97   Temp 98.9 F (37.2 C) (Oral)   Resp 16   Ht _0  (1.6 m)   Wt 116.1 kg   LMP 11/13/2018 (Approximate)   BMI 45.35 kg/m  EFM: 115, moderate variability, pos accels, no decels, reactive TOCO: difficult to trace currently   CVE: Dilation: 1 Effacement (%): Thick Station: -3 Presentation: Vertex Exam by:: Dr. EDione Plover  A&P: 24y.o. G1P0000 376w2dere for IOL due to FGR and iAEDF. #Labor: FB placed on admission and still in place. Will start low dose Pitocin. AROM PRN. Anticipate SVD. Give second BMZ in AM if still pregnant.  #Pain: per patient request #FWB: Cat I #GBS unknown; PCN for preterm status #gHTN: Pre-E labs negative, mild-range pressures currently; asymptomatic   ChChauncey MannMD 9:33 PM

## 2019-07-18 NOTE — H&P (Addendum)
LABOR AND DELIVERY ADMISSION HISTORY AND PHYSICAL NOTE  CLARETHA Stuart is a 24 y.o. female G1P0000 with IUP at [redacted]w[redacted]d by 6wk [place] presenting for IOL secondary to IUGR 5% with intermittent AEDF.   She reports positive fetal movement. She denies leakage of fluid, vaginal bleeding, or contractions.   She plans on breast and bottle feeding. Her contraception plan is: POPs.  Prenatal History/Complications: PNC at [facility]:  @[redacted]w[redacted]d , CWD, normal anatomy, cephalic presentation, posterior placenta, 5%ile, EFW 1992 g  Pregnancy complications:  - natural triplets with demise of fetus A at 8 weeks and C at 11 wks - IUGR at 5%lie - intermittent absent end diastolic flow  - gHTN   Past Medical History: Past Medical History:  Diagnosis Date  . Allergy   . Asthma   . Infection    Uti    Past Surgical History: Past Surgical History:  Procedure Laterality Date  . WISDOM TOOTH EXTRACTION      Obstetrical History: OB History    Gravida  1   Para  0   Term  0   Preterm  0   AB  0   Living  0     SAB  0   TAB  0   Ectopic  0   Multiple  0   Live Births              Social History: Social History   Socioeconomic History  . Marital status: Single    Spouse name: Not on file  . Number of children: 0  . Years of education: 70  . Highest education level: High school graduate  Occupational History    Comment: unemployed  Tobacco Use  . Smoking status: Former Smoker    Types: Cigars    Quit date: 01/06/2015    Years since quitting: 4.5  . Smokeless tobacco: Never Used  Substance and Sexual Activity  . Alcohol use: No    Alcohol/week: 0.0 standard drinks  . Drug use: Not Currently    Types: Marijuana  . Sexual activity: Yes  Other Topics Concern  . Not on file  Social History Narrative  . Not on file   Social Determinants of Health   Financial Resource Strain: Low Risk   . Difficulty of Paying Living Expenses: Not hard at all  Food Insecurity: No  Food Insecurity  . Worried About 03/08/2015 in the Last Year: Never true  . Ran Out of Food in the Last Year: Never true  Transportation Needs: No Transportation Needs  . Lack of Transportation (Medical): No  . Lack of Transportation (Non-Medical): No  Physical Activity: Inactive  . Days of Exercise per Week: 0 days  . Minutes of Exercise per Session: 0 min  Stress: Stress Concern Present  . Feeling of Stress : To some extent  Social Connections: Moderately Isolated  . Frequency of Communication with Friends and Family: More than three times a week  . Frequency of Social Gatherings with Friends and Family: Once a week  . Attends Religious Services: Never  . Active Member of Clubs or Organizations: No  . Attends Programme researcher, broadcasting/film/video Meetings: Never  . Marital Status: Never married    Family History: Family History  Problem Relation Age of Onset  . Diabetes Sister   . Diabetes Brother   . Asthma Brother   . Healthy Mother   . Cancer Father        colon, lung  . Hypertension  Maternal Grandmother   . Diabetes Maternal Grandmother   . Arthritis Maternal Grandmother   . Hypertension Maternal Grandfather     Allergies: Allergies  Allergen Reactions  . Dust Mite Extract Other (See Comments)    sneezing  . Other Hives    Allergic to Downy; Grass-sneezing, congestion, itchy throat; Apples, cucumbers-hives, TREE NUTS- ASTHMA ATTACK, POTATOES- HAND SWELLING.  . Peanuts [Peanut Oil] Other (See Comments)    Asthma attack  . Tomato     Wheezing. EARS BREAK OUT    Facility-Administered Medications Prior to Admission  Medication Dose Route Frequency Provider Last Rate Last Admin  . betamethasone acetate-betamethasone sodium phosphate (CELESTONE) injection 12 mg  12 mg Intramuscular Q24 Hr x 2 Florian Buff, MD   12 mg at 07/18/19 1137   Medications Prior to Admission  Medication Sig Dispense Refill Last Dose  . albuterol (VENTOLIN HFA) 108 (90 Base) MCG/ACT inhaler  Inhale 2 puffs into the lungs every 6 (six) hours as needed for wheezing or shortness of breath. 17 g 3 07/17/2019 at Unknown time  . calcium carbonate (OS-CAL) 1250 (500 Ca) MG chewable tablet Chew 1 tablet by mouth daily.   07/18/2019 at Unknown time  . loratadine (CLARITIN) 10 MG tablet Take 10 mg by mouth at bedtime.   07/18/2019 at Unknown time  . metronidazole (FLAGYL) 375 MG capsule Take 375 mg by mouth 2 (two) times daily.   Past Week at Unknown time  . Prenatal Vit-Fe Fumarate-FA (PRENATAL VITAMIN PO) Take 1 tablet by mouth daily.    07/18/2019 at Unknown time  . Blood Pressure Monitor MISC For regular home bp monitoring during pregnancy 1 each 0      Review of Systems  All systems reviewed and negative except as stated in HPI  Physical Exam Blood pressure (!) 143/92, pulse (!) 104, temperature 98.9 F (37.2 C), temperature source Oral, resp. rate 16, height 5\' 3"  (1.6 m), weight 116.1 kg, last menstrual period 11/13/2018. General appearance: alert, oriented, NAD Lungs: normal respiratory effort, clear to ascultation bilaterally  Heart: regular rate and rhythm  Abdomen: soft, non-tender; gravid Extremities: No calf swelling or tenderness  Fetal monitoringBaseline: 140 bpm, Variability: Good {> 6 bpm), Accelerations: Reactive and Decelerations: Absent Uterine activityNone     Prenatal labs: ABO, Rh: --/--/B POS, B POS Performed at [facility] [address] [place], [place] [address]  [phone]) Antibody: NEG (04/13 1458) Rubella: Immune (10/06 0000) RPR: Non Reactive (02/15 0832)  HBsAg: Negative (10/06 0000)  HIV: Non Reactive (02/15 0832)  GC/Chlamydia: neg/positive 07/07/2019, reports taking treatment GBS:   unknown, swab pending 2-hr GTT: normal 05/22/2019 Genetic screening:  Not done secondary to demises Anatomy US: IUGR, otherwise normal  Prenatal Transfer Tool  Maternal Diabetes: No Genetic Screening: not done due to multiple gestation  pregnancy Maternal Ultrasounds/Referrals: IUGR Fetal Ultrasounds or other Referrals:  None Maternal Substance Abuse:  No Significant Maternal Medications:  None Significant Maternal Lab Results: Other: GBS unknown  Results for orders placed or performed during the hospital encounter of 07/18/19 (from the past 24 hour(s))  Type and screen   Collection Time: 07/18/19  2:58 PM  Result Value Ref Range   ABO/RH(D) B POS    Antibody Screen NEG    Sample Expiration      07/21/2019,2359 Performed at [facility], [place] [address] [place], [place] [address]   ABO/Rh   Collection Time: 07/18/19  2:58 PM  Result Value Ref Range  ABO/RH(D)      B POS Performed at [facility], [address], [place], [place] [address]   CBC   Collection Time: 07/18/19  3:00 PM  Result Value Ref Range   WBC 7.9 4.0 - 10.5 K/uL   RBC 4.19 3.87 - 5.11 MIL/uL   Hemoglobin 11.8 (L) 12.0 - 15.0 g/dL   HCT 60.4 (L) 54.0 - 98.1 %   MCV 83.3 80.0 - 100.0 fL   MCH 28.2 26.0 - 34.0 pg   MCHC 33.8 30.0 - 36.0 g/dL   RDW 19.1 47.8 - 29.5 %   Platelets 272 150 - 400 K/uL   nRBC 0.0 0.0 - 0.2 %  Comprehensive metabolic panel   Collection Time: 07/18/19  3:00 PM  Result Value Ref Range   Sodium 137 135 - 145 mmol/L   Potassium 4.4 3.5 - 5.1 mmol/L   Chloride 108 98 - 111 mmol/L   CO2 19 (L) 22 - 32 mmol/L   Glucose, Bld 106 (H) 70 - 99 mg/dL   BUN 9 6 - 20 mg/dL   Creatinine, Ser 6.21 0.44 - 1.00 mg/dL   Calcium 9.4 8.9 - 30.8 mg/dL   Total Protein 6.9 6.5 - 8.1 g/dL   Albumin 3.1 (L) 3.5 - 5.0 g/dL   AST 17 15 - 41 U/L   ALT 16 0 - 44 U/L   Alkaline Phosphatase 81 38 - 126 U/L   Total Bilirubin 0.5 0.3 - 1.2 mg/dL   GFR calc non Af Amer >60 >60 mL/min   GFR calc Af Amer >60 >60 mL/min   Anion gap 10 5 - 15    Patient Active Problem List   Diagnosis Date Noted  . Poor fetal growth 07/18/2019  . Supervision of high risk pregnancy, antepartum 02/17/2019  . Multiple gestation 02/17/2019   . Positive urine drug screen 01/16/2019  . Mild intermittent asthma 02/11/2015  . Allergic rhinitis due to pollen 02/11/2015  . Oral allergy syndrome 02/11/2015  . Dyspnea 12/24/2012  . Hypoxemia 12/24/2012    Assessment: ORIAN AMBERG is a 24 y.o. G1P0000 at [redacted]w[redacted]d here for IOL due to IUGR 5% and intermittent AEDF per discussion w MFM.  #Labor: Admit to L&D. FB to be placed, will allow period of expectant management, if not out within ~6hr will start low dose pitocin #Pain: IV pain meds PRN, epidural upon request #FWB: Cat I #GBS/ID: unknown, start penicillin #COVID: swab pending #MOF: Breast and bottle  #MOC: POPs #Circ: Yes, at FT  #Preterm: Given BMZ x1 at [facility], give second dose tomorrow in AM if undelivered  #gHTN: meets criteria based on prior elevated BP's and prior labs, repeats today pending and currently asymptomatic, follow up labs and trend BP's  #Chalmydia: +07/07/2019, treatment sent 07/10/2019, reports she took treatment. TOC in 2-3 weeks  Katha Cabal, DO  07/18/2019, 4:10 PM     OB FELLOW ATTESTATION  I have seen and examined this patient and edited the above documentation in the resident's note to reflect any changes or updates.  Zack Seal, MD/[facility] OB Fellow  07/18/2019, 4:48 PM

## 2019-07-18 NOTE — Addendum Note (Signed)
Addended by: Annamarie Dawley on: 07/18/2019 12:02 PM   Modules accepted: Orders

## 2019-07-19 ENCOUNTER — Other Ambulatory Visit: Payer: Medicaid Other

## 2019-07-19 ENCOUNTER — Inpatient Hospital Stay (HOSPITAL_COMMUNITY): Payer: Medicaid Other | Admitting: Anesthesiology

## 2019-07-19 DIAGNOSIS — O365931 Maternal care for other known or suspected poor fetal growth, third trimester, fetus 1: Secondary | ICD-10-CM

## 2019-07-19 DIAGNOSIS — Z3A35 35 weeks gestation of pregnancy: Secondary | ICD-10-CM

## 2019-07-19 DIAGNOSIS — O134 Gestational [pregnancy-induced] hypertension without significant proteinuria, complicating childbirth: Secondary | ICD-10-CM

## 2019-07-19 LAB — CBC
HCT: 36 % (ref 36.0–46.0)
HCT: 33.4 % — ABNORMAL LOW (ref 36.0–46.0)
Hemoglobin: 12.1 g/dL (ref 12.0–15.0)
Hemoglobin: 11.3 g/dL — ABNORMAL LOW (ref 12.0–15.0)
MCH: 28.3 pg (ref 26.0–34.0)
MCH: 28.4 pg (ref 26.0–34.0)
MCHC: 33.6 g/dL (ref 30.0–36.0)
MCHC: 33.8 g/dL (ref 30.0–36.0)
MCV: 84.1 fL (ref 80.0–100.0)
MCV: 83.9 fL (ref 80.0–100.0)
Platelets: 314 10*3/uL (ref 150–400)
Platelets: 264 10*3/uL (ref 150–400)
RBC: 4.28 MIL/uL (ref 3.87–5.11)
RBC: 3.98 MIL/uL (ref 3.87–5.11)
RDW: 15.4 % (ref 11.5–15.5)
RDW: 15.6 % — ABNORMAL HIGH (ref 11.5–15.5)
WBC: 13.3 10*3/uL — ABNORMAL HIGH (ref 4.0–10.5)
WBC: 12.9 10*3/uL — ABNORMAL HIGH (ref 4.0–10.5)
nRBC: 0 % (ref 0.0–0.2)
nRBC: 0 % (ref 0.0–0.2)

## 2019-07-19 LAB — RPR: RPR Ser Ql: NONREACTIVE

## 2019-07-19 LAB — CERVICOVAGINAL ANCILLARY ONLY
Chlamydia: POSITIVE — AB
Comment: NEGATIVE
Comment: NORMAL
Neisseria Gonorrhea: NEGATIVE

## 2019-07-19 MED ORDER — SODIUM CHLORIDE (PF) 0.9 % IJ SOLN
INTRAMUSCULAR | Status: DC | PRN
  Administered 2019-07-19: 12 mL/h via EPIDURAL

## 2019-07-19 MED ORDER — COCONUT OIL OIL: 1.0000 "application " | TOPICAL_OIL | Status: DC | PRN

## 2019-07-19 MED ORDER — BENZOCAINE-MENTHOL 20-0.5 % EX AERO
1.0000 "application " | INHALATION_SPRAY | CUTANEOUS | Status: DC | PRN
  Administered 2019-07-19: 1 via TOPICAL
  Filled 2019-07-19: qty 56

## 2019-07-19 MED ORDER — MAGNESIUM HYDROXIDE 400 MG/5ML PO SUSP: 30.0000 mL | ORAL | Status: DC | PRN

## 2019-07-19 MED ORDER — SENNOSIDES-DOCUSATE SODIUM 8.6-50 MG PO TABS
2.0000 | ORAL_TABLET | ORAL | Status: DC
  Administered 2019-07-19 – 2019-07-20 (×2): 2 via ORAL
  Filled 2019-07-19 (×2): qty 2

## 2019-07-19 MED ORDER — DIPHENHYDRAMINE HCL 25 MG PO CAPS: 25.0000 mg | ORAL_CAPSULE | Freq: Four times a day (QID) | ORAL | Status: DC | PRN

## 2019-07-19 MED ORDER — WITCH HAZEL-GLYCERIN EX PADS: 1.0000 "application " | MEDICATED_PAD | CUTANEOUS | Status: DC | PRN

## 2019-07-19 MED ORDER — ONDANSETRON HCL 4 MG PO TABS: 4.0000 mg | ORAL_TABLET | ORAL | Status: DC | PRN

## 2019-07-19 MED ORDER — ONDANSETRON HCL 4 MG/2ML IJ SOLN: 4.0000 mg | INTRAMUSCULAR | Status: DC | PRN

## 2019-07-19 MED ORDER — LACTATED RINGERS IV SOLN: 500.0000 mL | Freq: Once | INTRAVENOUS | Status: DC

## 2019-07-19 MED ORDER — LIDOCAINE HCL (PF) 1 % IJ SOLN
INTRAMUSCULAR | Status: DC | PRN
  Administered 2019-07-19: 5 mL via EPIDURAL
  Administered 2019-07-19: 3 mL via EPIDURAL
  Administered 2019-07-19: 2 mL via EPIDURAL

## 2019-07-19 MED ORDER — OXYCODONE HCL 5 MG PO TABS: 10.0000 mg | ORAL_TABLET | ORAL | Status: DC | PRN

## 2019-07-19 MED ORDER — ACETAMINOPHEN 325 MG PO TABS: 650.0000 mg | ORAL_TABLET | ORAL | Status: DC | PRN

## 2019-07-19 MED ORDER — PRENATAL MULTIVITAMIN CH
1.0000 | ORAL_TABLET | Freq: Every day | ORAL | Status: DC
  Administered 2019-07-20 – 2019-07-21 (×2): 1 via ORAL
  Filled 2019-07-19 (×2): qty 1

## 2019-07-19 MED ORDER — OXYCODONE HCL 5 MG PO TABS: 5.0000 mg | ORAL_TABLET | ORAL | Status: DC | PRN

## 2019-07-19 MED ORDER — DIBUCAINE (PERIANAL) 1 % EX OINT: 1.0000 "application " | TOPICAL_OINTMENT | CUTANEOUS | Status: DC | PRN

## 2019-07-19 MED ORDER — SIMETHICONE 80 MG PO CHEW: 80.0000 mg | CHEWABLE_TABLET | ORAL | Status: DC | PRN

## 2019-07-19 MED ORDER — IBUPROFEN 600 MG PO TABS
600.0000 mg | ORAL_TABLET | Freq: Four times a day (QID) | ORAL | Status: DC
  Administered 2019-07-19 – 2019-07-21 (×8): 600 mg via ORAL
  Filled 2019-07-19 (×8): qty 1

## 2019-07-19 NOTE — Progress Notes (Signed)
Labor Progress Note PHYILLIS DASCOLI is a 24 y.o. G1P0000 at [redacted]w[redacted]d presented for IOL for IUGR, iAEDF and gHTN S:  Resting.  Feeling better after receiving epidural  O:  BP 124/78   Pulse 78   Temp 98.3 F (36.8 C) (Oral)   Resp 18   Ht 5\' 3"  (1.6 m)   Wt 116.1 kg   LMP 11/13/2018 (Approximate)   BMI 45.35 kg/m  EFM: 120/mod variability/accels present  CVE: Dilation: 5 Effacement (%): 70, 80 Cervical Position: Middle Station: -2 Presentation: Vertex Exam by:: [place], rn   A&P: 24 y.o. G1P0000 [redacted]w[redacted]d here for IOL d/t IUGR, intermittent and absent EDF, gHTN.   #Labor: Progressing well. S/p FB, arom @ 650, and IPUC placement. On pit @ 24.   #Pain: epidural placed #FWB: cat I #GBS unknown. PCN  #gHTN: negative pre-E labs.  Has had occasional hypertensive readings. Asymptomatic.  [redacted]w[redacted]d, MD 12:12 PM

## 2019-07-19 NOTE — Lactation Note (Signed)
This note was copied from a baby's chart. Lactation Consultation Note  Patient Name: Elizabeth Stuart [id number] Date: 07/19/2019 Reason for consult: Initial assessment;Late-preterm 34-36.6wks;Infant < 6lbs   Mom has a goal of breastfeeding and providing formula to infant. Mom says when infant was at the breast he wasn't interested.  LC discussed options for giving EBM; providing EBM with hand expression, stimulating milk supply by pumping, and continuing to offer breast to infant prior to supplementation.    Maternal grandmother and maternal grandfather of infant at bedside.  Mom desired to try to pump; family was very supportive.    Mom had + breast changes during pregnancy.  She was able to hand express her colostrum.  Two drops collected and placed inside infants cheeks.  Mom attempted to bf with assist from [facility] but infant was too sleepy to latch.    LPTI behaviors and guidelines reviewed with family. She given to family covering LPTI guidelines. Mom desires to try to pump.  DEBP set up and milk collection, storage, and cleaning pump parts were all reviewed.  Mom pumped using 24 size flange, 21 was attempted at first with left breast but comparing the two, the 24 fit better. LC encouraged mom to call out if she had further questions.    LC encouraged mom to try to pump every 2-3 to stimulate milk production. Allow infant to lick and learn and call out for assistance with latch.  Mom knows to try to bf infatfeed with cues, then supplement with formula or expressed breast milk if she collects any.  LC encouraged mom to pump every 2-3 hours/ with a longer stretch at night if needed/ or 8 times in 24 hours.    BFSG info and OP lactation information shared with family.  They were provided our brochure and are aware of the lactation phone line as well.  Maternal Data Has patient been taught Hand Expression?: Yes Does the patient have breastfeeding experience prior to this delivery?:  No  Feeding Feeding Type: Breast Milk Nipple Type: Slow - flow  LATCH Score                   Interventions Interventions: Breast feeding basics reviewed;Assisted with latch;Skin to skin;Breast massage;Hand express;Position options;DEBP  Lactation Tools Discussed/Used WIC Program: Yes Pump Review: Setup, frequency, and cleaning;Milk Storage Initiated by:: Threasa Alpha Date initiated:: 07/19/19   Consult Status Consult Status: Follow-up Date: 07/20/19 Follow-up type: In-patient    Maryruth Hancock [facility] 07/19/2019, 9:41 PM

## 2019-07-19 NOTE — Progress Notes (Signed)
Labor Progress Note Elizabeth Stuart is a 24 y.o. G1P0000 at [redacted]w[redacted]d presented for IOL due to FGR and iAEDF. S: Overall comfortable.   O:  BP 135/71   Pulse 77   Temp 98.2 F (36.8 C) (Axillary)   Resp 15   Ht 5\' 3"  (1.6 m)   Wt 116.1 kg   LMP 11/13/2018 (Approximate)   BMI 45.35 kg/m  EFM: 125, moderate variability, pos accels, no decels, reactive TOCO: pod placed and starting to trace now  CVE: Dilation: 4.5 Effacement (%): 50 Cervical Position: Posterior Station: -3 Presentation: Vertex Exam by:: Mary [email or web address] Johnson, RN    A&P: 24 y.o. G1P0000 [redacted]w[redacted]d here for IOL due to FGR and iAEDF. #Labor: S/p FB. Cont Pit titration as baby will tolerate. Pod placed now and able to see ctx. AROM PRN. Anticipate SVD. Give second BMZ in AM if still pregnant.  #Pain: per patient request #FWB: Cat I currently, had a small prolonged and a few variables earlier  #GBS unknown; PCN for preterm status #gHTN: Pre-E labs negative, mild-range pressures currently; asymptomatic   [redacted]w[redacted]d, MD 2:01 AM

## 2019-07-19 NOTE — Anesthesia Preprocedure Evaluation (Signed)
Anesthesia Evaluation  Patient identified by MRN, date of birth, ID band Patient awake    Reviewed: Allergy & Precautions, NPO status , Patient's Chart, lab work & pertinent test results  Airway Mallampati: III  TM Distance: >3 FB Neck ROM: Full    Dental  (+) Teeth Intact, Dental Advisory Given   Pulmonary asthma , former smoker,    Pulmonary exam normal breath sounds clear to auscultation       Cardiovascular hypertension (GHTN), Normal cardiovascular exam Rhythm:Regular Rate:Normal     Neuro/Psych negative neurological ROS     GI/Hepatic negative GI ROS, Neg liver ROS,   Endo/Other  Morbid obesity  Renal/GU negative Renal ROS     Musculoskeletal negative musculoskeletal ROS (+)   Abdominal   Peds  Hematology negative hematology ROS (+) Plt 314k   Anesthesia Other Findings Day of surgery medications reviewed with the patient.  Reproductive/Obstetrics (+) Pregnancy                             Anesthesia Physical Anesthesia Plan  ASA: III  Anesthesia Plan: Epidural   Post-op Pain Management:    Induction:   PONV Risk Score and Plan: 2 and Treatment may vary due to age or medical condition  Airway Management Planned: Natural Airway  Additional Equipment:   Intra-op Plan:   Post-operative Plan:   Informed Consent: I have reviewed the patients History and Physical, chart, labs and discussed the procedure including the risks, benefits and alternatives for the proposed anesthesia with the patient or authorized representative who has indicated his/her understanding and acceptance.     Dental advisory given  Plan Discussed with:   Anesthesia Plan Comments: (Patient identified. Risks/Benefits/Options discussed with patient including but not limited to bleeding, infection, nerve damage, paralysis, failed block, incomplete pain control, headache, blood pressure changes, nausea,  vomiting, reactions to medication both or allergic, itching and postpartum back pain. Confirmed with bedside nurse the patient's most recent platelet count. Confirmed with patient that they are not currently taking any anticoagulation, have any bleeding history or any family history of bleeding disorders. Patient expressed understanding and wished to proceed. All questions were answered. )        Anesthesia Quick Evaluation

## 2019-07-19 NOTE — Progress Notes (Signed)
Labor Progress Note Elizabeth Stuart is a 24 y.o. G1P0000 at [redacted]w[redacted]d presented for IOL due to FGR and iAEDF. S: Resting, not feeling ctx.   O:  BP 134/77   Pulse 73   Temp 98.1 F (36.7 C) (Oral)   Resp 14   Ht 5\' 3"  (1.6 m)   Wt 116.1 kg   LMP 11/13/2018 (Approximate)   BMI 45.35 kg/m  EFM: 125, moderate variability, pos accels, no decels, reactive TOCO: q2-23m  CVE: Dilation: 5 Effacement (%): 60 Cervical Position: Posterior Station: -2 Presentation: Vertex Exam by:: [email or web address], RN   A&P: 24 y.o. G1P0000 [redacted]w[redacted]d here for IOL due to FGR and iAEDF. #Labor: S/p FB. Patient not feeling any ctx and has been on Pitocin for about 10 hours. AROM for clear fluid at this exam. IUPC placed left anterior. Titrate Pit to adequate MVU's. Will order second BMZ for now.  #Pain: per patient request #FWB: Cat  #GBS unknown; PCN for preterm status #gHTN: Pre-E labs negative, normal-range pressures currently; asymptomatic   [redacted]w[redacted]d, MD 6:51 AM

## 2019-07-19 NOTE — Discharge Summary (Signed)
Postpartum Discharge [place]    Patient Name: Elizabeth Stuart DOB: 03-06-1996 MRN: [id number]  Date of admission: 07/18/2019 Delivering Provider: Clarnce Flock   Date of discharge: 07/21/2019  Admitting diagnosis: Poor fetal growth [P05.9] Intrauterine pregnancy: [redacted]w[redacted]d    Secondary diagnosis:  Active Problems:   Positive urine drug screen   Supervision of high risk pregnancy, antepartum   Multiple gestation   Poor fetal growth   Gestational hypertension   NSVD (normal spontaneous vaginal delivery)  Additional problems: continued borderline BPs     Discharge diagnosis: Preterm Pregnancy Delivered and Gestational Hypertension                                                                                                Post partum procedures:none  Augmentation: AROM, Pitocin and Foley Balloon  Complications: None  Hospital course:  Induction of Labor With Vaginal Delivery   24y.o. yo G1P0000 at 369w3das admitted to the hospital 07/18/2019 for induction of labor.  Indication for induction: Gestational hypertension and FGR (5%) and intermittent absent end diastolic flow.  Patient had an uncomplicated labor course as follows: arrived at 1cm and induced with foley bulb, pitocin, and AROM and progressed to complete. Membrane Rupture Time/Date: 6:46 AM ,07/19/2019   Intrapartum Procedures: Episiotomy: None [1]                                         Lacerations:  1st degree [2];Perineal [11]  Patient had delivery of a Viable infant.  Information for the patient's newborn:  PeLatanya, Hemmer0[[id number]]Delivery Method: Vag-Spont    07/19/2019  Details of delivery can be found in separate delivery note.  Patient had a routine postpartum course. Patient is discharged home 07/21/19. Delivery time: 1:53 PM    Magnesium Sulfate received: No BMZ received: Yes Rhophylac:N/A MMR:N/A Transfusion:No  Physical exam  Vitals:   07/20/19 0503 07/20/19 1301 07/20/19 2101 07/21/19 0510   BP: 133/77 131/78 111/66 131/86  Pulse: 86 78 85 86  Resp: '17 20 17 18  '$ Temp: 98.9 F (37.2 C) 98.4 F (36.9 C) 99.1 F (37.3 C) 98.6 F (37 C)  TempSrc: Oral Oral Oral Oral  SpO2: 98% 98% 99%   Weight:      Height:       General: alert, cooperative and no distress Lochia: appropriate Uterine Fundus: firm Incision: N/A DVT Evaluation: No evidence of DVT seen on physical exam. Negative Homan's sign. No cords or calf tenderness. Labs: Lab Results  Component Value Date   WBC 12.9 (H) 07/19/2019   HGB 11.3 (L) 07/19/2019   HCT 33.4 (L) 07/19/2019   MCV 83.9 07/19/2019   PLT 264 07/19/2019   CMP Latest Ref Rng & Units 07/18/2019  Glucose 70 - 99 mg/dL 106(H)  BUN 6 - 20 mg/dL 9  Creatinine 0.44 - 1.00 mg/dL 0.74  Sodium 135 - 145 mmol/L 137  Potassium 3.5 - 5.1 mmol/L 4.4  Chloride 98 - 111 mmol/L 108  CO2 22 - 32 mmol/L 19(L)  Calcium 8.9 - 10.3 mg/dL 9.4  Total Protein 6.5 - 8.1 g/dL 6.9  Total Bilirubin 0.3 - 1.2 mg/dL 0.5  Alkaline Phos 38 - 126 U/L 81  AST 15 - 41 U/L 17  ALT 0 - 44 U/L 16   Edinburgh Score: Edinburgh Postnatal Depression Scale Screening Tool 07/20/2019  I have been able to laugh and see the funny side of things. 0  I have looked forward with enjoyment to things. 0  I have blamed myself unnecessarily when things went wrong. 2  I have been anxious or worried for no good reason. 2  I have felt scared or panicky for no good reason. 1  Things have been getting on top of me. 1  I have been so unhappy that I have had difficulty sleeping. 1  I have felt sad or miserable. 1  I have been so unhappy that I have been crying. 0  The thought of harming myself has occurred to me. 0  Edinburgh Postnatal Depression Scale Total 8    Discharge instruction: per After Visit Summary and "Baby and Me Booklet".  After visit meds:  Allergies as of 07/21/2019      Reactions   Apple Hives   Cucumber Extract Hives   Dust Mite Extract Other (See Comments)    sneezing   Other Hives   Allergic to Downy; Grass-sneezing, congestion, itchy throat; POTATOES- HAND SWELLING.   Peanuts [peanut Oil] Other (See Comments)   Patient is allergic to both peanuts and tree nuts, both trigger Asthma attack   Tomato Other (See Comments)   Wheezing. EARS BREAK OUT      Medication List    STOP taking these medications   Blood Pressure Monitor Misc   calcium carbonate 500 MG chewable tablet Commonly known as: TUMS - dosed in mg elemental calcium   loratadine 10 MG tablet Commonly known as: CLARITIN     TAKE these medications   albuterol 108 (90 Base) MCG/ACT inhaler Commonly known as: VENTOLIN HFA Inhale 2 puffs into the lungs every 6 (six) hours as needed for wheezing or shortness of breath.   enalapril 2.5 MG tablet Commonly known as: VASOTEC Take 1 tablet (2.5 mg total) by mouth daily.   ibuprofen 600 MG tablet Commonly known as: ADVIL Take 1 tablet (600 mg total) by mouth every 6 (six) hours.   norethindrone 0.35 MG tablet Commonly known as: MICRONOR Start on the Sunday after baby turns 78 weeks old   prenatal multivitamin Tabs tablet Take 1 tablet by mouth daily at 12 noon.       Diet: routine diet  Activity: Advance as tolerated. Pelvic rest for 6 weeks.   Outpatient follow up: 4 weeks Follow up Appt:07/26/19 for BP check (can be virtual)    Please schedule this patient for Postpartum visit in: 6 weeks with the following provider: Any provider Virtual For C/S patients schedule nurse incision check in weeks 2 weeks: no High risk pregnancy complicated by: triplet pregnancy w demise of two fetuses, gHTN, IUGR Delivery mode:  SVD Anticipated Birth Control:  POPs PP Procedures needed: BP check  Schedule Integrated [place] visit: no   Newborn Data: Live born female  Birth Weight: 4 lb 8 oz (2040 g) APGAR: 8, 9  Newborn Delivery   Birth date/time: 07/19/2019 13:53:00 Delivery type: Vaginal, Spontaneous      Baby Feeding: Bottle  and Breast Disposition:rooming in   07/21/2019 Christin Fudge, CNM

## 2019-07-20 ENCOUNTER — Encounter (HOSPITAL_COMMUNITY): Payer: Self-pay | Admitting: Obstetrics & Gynecology

## 2019-07-20 MED ORDER — IBUPROFEN 600 MG PO TABS: 600.0000 mg | ORAL_TABLET | Freq: Four times a day (QID) | ORAL | 0 refills | Status: AC

## 2019-07-20 MED ORDER — NORETHINDRONE 0.35 MG PO TABS: ORAL_TABLET | ORAL | 11 refills | Status: AC

## 2019-07-20 NOTE — Lactation Note (Signed)
This note was copied from a baby's chart. Lactation Consultation Note  Patient Name: Elizabeth Stuart RWERX'V Date: 07/20/2019 Reason for consult: Follow-up assessment;Late-preterm 34-36.6wks;Primapara;1st time breastfeeding;Infant < 6lbs;Other (Comment)(> 5 pounds ,) Baby is 60 hours old  As LC entered the room baby asleep in the bedside crib and mom about ready to  Eat lunch.  Per mom baby last fed at 12:30 P 17 ml of pumped milk.  Mom mentioned she had pumped 5 ml from the left and 12 ml from the right.  [place] praised mom for pumping 4-5 times and the EBM yield.  LC reviewed supply and demand / importance of consistent pumping around the  Clock whether baby latches or not both breast for 15 -20 mins  8- 10 times a day.  LC also recommended mom could power pump once a day 20 mins on 10 mins off 20 mins on 10 mins off.  Mom aware the volume will be up and down and then get more  Consistent. Mom mentioned the #24 F is comfortable and she is  Pumping one breast at a time.  LC recommended to consider sitting on the side of the bed when pumping  Allow breast to hang natural and use the larger longer bottles from the pumping  Kit so she can hold then easier and more comfortable to enhance let down.  LC explained to mom when she is pumping both breast one breast flows quicker than the other and enhances that other breast to let down.  LC reassured mom it will get easier has she is pumping both breast together.  LC stressed the importance of still placing the baby STS and she could attempt  To latch.    Maternal Data    Feeding Feeding Type: (pe rmom baby was fed recently and updated) Nipple Type: Slow - flow  LATCH Score                   Interventions Interventions: Breast feeding basics reviewed  Lactation Tools Discussed/Used Tools: Pump;Flanges Flange Size: 24(per mom comfortable) Breast pump type: Double-Electric Breast Pump   Consult Status Consult Status:  Follow-up Date: 07/21/19 Follow-up type: In-patient    [place] 07/20/2019, 1:54 PM

## 2019-07-20 NOTE — Progress Notes (Signed)
POSTPARTUM PROGRESS NOTE  Post Operative Day 2 Subjective:  Elizabeth Stuart is a 24 y.o. G1P0000 [redacted]w[redacted]d s/p IOL for IUGR, iAEDF and gHTN .  No acute events overnight.  Pt denies problems with ambulating, voiding or po intake.  She denies nausea or vomiting.  Pain is well controlled.  She has had flatus. She has not had bowel movement.  Lochia Small.   Patient has decided to formula feed only due to breastfeeding being too exhausting for her.    Objective: Blood pressure 133/77, pulse 86, temperature 98.9 F (37.2 C), temperature source Oral, resp. rate 17, height 5\' 3"  (1.6 m), weight 116.1 kg, last menstrual period 11/13/2018, SpO2 98 %.  Physical Exam:  General: alert, cooperative and no distress Lochia:normal flow Chest: no respiratory distress Heart:regular rate, distal pulses intact Abdomen: soft, nontender,  Uterine Fundus: firm, appropriately tender DVT Evaluation: No calf swelling or tenderness Extremities: minimal edema  Recent Labs    07/19/19 0838 07/19/19 1430  HGB 12.1 11.3*  HCT 36.0 33.4*    Assessment/Plan:  ASSESSMENT: Elizabeth Stuart is a 24 y.o. G1P0000 [redacted]w[redacted]d s/p IOL/SVD for IUGR, iAEDF, gHTN who is doing well today.  She has decided to formula feed exclusively.  Otherwise has no complaints.   Plan for discharge tomorrow and Contraception : POPs   LOS: 2 days   [redacted]w[redacted]d 07/20/2019, 7:02 AM

## 2019-07-20 NOTE — Discharge Instructions (Signed)

## 2019-07-20 NOTE — Clinical Social Work Maternal (Signed)
CLINICAL SOCIAL WORK MATERNAL/CHILD NOTE  Patient Details  Name: Elizabeth Stuart MRN: [id number] Date of Birth: 03/21/1996  Date:  2019/12/17  Clinical Social Worker Initiating Note:  Hortencia Pilar, LCSW Date/Time: Initiated:  07/20/19/0900     Child's Name:  Normajean Baxter   Biological Parents:  Mother   Need for Interpreter:  None   Reason for Referral:  Current Substance Use/Substance Use During Pregnancy    Address:  [address] [place] [address]    Phone number:  [phone] (home)     Additional phone number: none   Household Members/Support Persons (HM/SP):   Household Member/Support Person 2   HM/SP Name Relationship DOB or Age  HM/SP -1  Elizabeth Stuart   MOB   3  HM/SP -2 Elizabeth Stuart  brother   33  HM/SP -3        HM/SP -4        HM/SP -5        HM/SP -6        HM/SP -7        HM/SP -8          Natural Supports (not living in the home):  Parent   Professional Supports: Therapist(Jamie with [facility])   Employment: Unemployed   Type of Work: none   Education:    Some Corporate treasurer arranged:  n/a  Surveyor, quantity Resources:  Medicaid   Other Resources:  Sales executive , WIC   Cultural/Religious Considerations Which May Impact Care:  none reported.   Strengths:  Ability to meet basic needs , Compliance with medical plan , Home prepared for child , Pediatrician chosen   Psychotropic Medications:       None reported.   Pediatrician:    [place]  Pediatrician List:   [facility]  [facility]      Pediatrician Fax Number:    Risk Factors/Current Problems:  None   Cognitive State:  Insightful , Able to Concentrate , Alert    Mood/Affect:  Comfortable , Calm , Relaxed , Happy , Interested    CSW Assessment: CSW consulted as MOB used THC earlier in pregnancy. CSW went to speak with MOB at bedside to address further needs.    CSW congratulated MOB on the birth of infant. CSW advised MOB of CSWs' role and the reason for CSW coming to visit with her. MOB reported that she used THC earlier in her pregnancy. MOB reported that her pregnancy was confirmed on December 11, 2018 and then after she didn't use. MOB reported that she stopped all substance use. CSW understanding of this and advised MOB of hospital drug screen policy. MOB was advised that infants UDS is negative, however CSW would monitor infants CDS and make report if needed. MOB denies any other use and reports that she understands policy.   MOB reported that she has hx of depression and anxiety. MOB reported that she dealt with depression during this pregnancy as MOB reports that she was pregnant with triplets, however "the others didn't make it". CSW offered support to MOB and validated her feelings of sadness as it relates to loss of other fetuses. MOB reported that she is seeing a therapist Asher Muir with [facility]) and has access to follow up with her as needed. MOB reported that she had anxiety in the past but reports no current  symptoms of anxiety or depression. MOB denies SI and HI and reports that she isn't in a DV relationship. MOB expressed being excited "I cant stop staring at him".   CSW took time to provide MOB with PPD and SIDS education. MOB was given PPD Checklist in order to keep track of feelings as they relate to PPD. MOB reported that she has no other needs or concerns at this time. CSW has provided MOB with Baby Bundle as MOB reports that all clothes are to big for infant. MOB reports that she has support from her mom.   CSW will continue to monitor infants CDS and make report if warranted.   CSW Plan/Description:  No Further Intervention Required/No Barriers to Discharge, Sudden Infant Death Syndrome (SIDS) Education, Perinatal Mood and Anxiety Disorder (PMADs) Education, CSW Will Continue to Monitor Umbilical Cord Tissue Drug Screen Results and Make Report  if Warranted, Hospital Drug Screen Policy Information    Elier Zellars S Keante Urizar, LCSWA 07/20/2019, 10:14 AM  

## 2019-07-20 NOTE — Lactation Note (Signed)
This note was copied from a baby's chart. Lactation Consultation Note Baby is 7 hrs old. Spoke w/mom about increasing baby's PO intake. Mom stated she is slowly getting there. Discussed w/mom to feed baby then if he is tired give him a break and try again within the hour to increase volume intake. Mom was pumping when LC came to rm. Praised mom for her hard work. Discussed engorgement and milk storage. Encouraged f/u w/OP Lactation. LC doesn't feel baby is ready for d/c home until intake volume increases d/t LBW. Baby wt. 4.3 lbs. Mom is doing a great job. Encouraged to call for assistance or questions.  Patient Name: Elizabeth Stuart SUPJS'R Date: 07/20/2019 Reason for consult: Follow-up assessment;Infant < 6lbs;Primapara;Late-preterm 34-36.6wks   Maternal Data    Feeding Feeding Type: Breast Milk Nipple Type: Dr. Lorne Skeens  LATCH Score                   Interventions Interventions: DEBP  Lactation Tools Discussed/Used Breast pump type: Double-Electric Breast Pump   Consult Status Consult Status: Follow-up Date: 07/21/19 Follow-up type: In-patient    Charyl Dancer 07/20/2019, 10:49 PM

## 2019-07-20 NOTE — Progress Notes (Signed)
   07/20/19 1554  [place]our[place] Rounding  Assessment Alert  Intervention Call light w/in reach;Environment secured;Family sitting w/ patient  Pain Assessment  Pain Assessment No/denies pain  Pain Score 0  POSS Scale (Pasero Opioid Sedation Scale)  POSS *See Group Information* 1-Acceptable,Awake and alert  Magnesium Sulfate  Patient on Magnesium Sulfate? No  Fundal Assessment  Fundal Tone Firm  Fundal Position Midline  Fundus U/1  Lochia  Color Rubra  Amount Scant  Odor None  Clots Few  Clot Size 1   MOB asked RN to assess the clot that she passed in the toilet. Clot was assessed. RN assessed 1 egg size clot that was easily breakable. Pt states that she's been active and moving around today in her room. RN informed mom to let us know if she passes several clots and if her bleeding increases.

## 2019-07-21 ENCOUNTER — Ambulatory Visit (HOSPITAL_COMMUNITY): Payer: Medicaid Other

## 2019-07-21 ENCOUNTER — Encounter (HOSPITAL_COMMUNITY): Payer: Self-pay

## 2019-07-21 LAB — SURGICAL PATHOLOGY

## 2019-07-21 MED ORDER — ENALAPRIL MALEATE 2.5 MG PO TABS: 2.5000 mg | ORAL_TABLET | Freq: Every day | ORAL | 2 refills | Status: DC

## 2019-07-21 MED ORDER — AZITHROMYCIN 500 MG PO TABS
1000.0000 mg | ORAL_TABLET | Freq: Once | ORAL | Status: AC
  Administered 2019-07-21: 1000 mg via ORAL
  Filled 2019-07-21: qty 2

## 2019-07-21 MED ORDER — ENALAPRIL MALEATE 5 MG PO TABS
2.5000 mg | ORAL_TABLET | Freq: Every day | ORAL | Status: DC
  Administered 2019-07-21: 2.5 mg via ORAL
  Filled 2019-07-21: qty 1

## 2019-07-21 MED FILL — ENALAPRIL MALEATE 2.5 MG TA: 2.5 | 30 days supply | Qty: 30 | Fill #0

## 2019-07-21 NOTE — Lactation Note (Signed)
This note was copied from a baby's chart. Lactation Consultation Note  Patient Name: Elizabeth Stuart HUDJS'H Date: 07/21/2019 Reason for consult: Follow-up assessment;1st time breastfeeding;Primapara;Late-preterm 34-36.6wks, 6% wt loss. Pecola Leisure is 74hrs old. Patient sitting in bed using DEBP in single mode. Education given on benefits of pumping breasts at same time. Patient setup to pump breast bilat. Reinforced collection, storage of EBM and frequency of pumping. Re-fitted and changed flange to 15mm bilat, patient voiced comfort, instructed on maintenance mode due to increase in milk volume, patient collected 68ml of EBM. Patient reports wanting baby to gain weight first, in the interim would like to exclusively pump and bottle feed EBM, reports interest in wanting to possibly latch in the future. Pt connected with WIC- [place] (previous [facility] sent referral), will offer loaner DEBP if discharged over weekend. Patient praised for pumping. Patient voiced understanding and with no further concerns.   Maternal Data Formula Feeding for Exclusion: No  Feeding Feeding Type: (Patient collected 82ml of EBM.) Nipple Type: Dr. Lorne Skeens  LATCH Score      Interventions Interventions: Breast feeding basics reviewed;DEBP;Expressed milk  Lactation Tools Discussed/Used Tools: Flanges Flange Size: 24;21(Patient re-sized. Flange sized changed to 74mm.) Breast pump type: Double-Electric Breast Pump Pump Review: Setup, frequency, and cleaning;Milk Storage   Consult Status Consult Status: Follow-up Date: 07/22/19 Follow-up type: In-patient    Charlynn Court 07/21/2019, 11:10 AM

## 2019-07-21 NOTE — Anesthesia Postprocedure Evaluation (Signed)
Anesthesia Post Note  Patient: Elizabeth Stuart  Procedure(s) Performed: AN AD HOC LABOR EPIDURAL     Patient location during evaluation: Mother Baby Anesthesia Type: Epidural Level of consciousness: awake and alert Pain management: pain level controlled Vital Signs Assessment: post-procedure vital signs reviewed and stable Respiratory status: spontaneous breathing, nonlabored ventilation and respiratory function stable Cardiovascular status: stable Postop Assessment: no headache, no backache and epidural receding Anesthetic complications: no    Last Vitals: There were no vitals filed for this visit.  Last Pain: There were no vitals filed for this visit.               Cecile Hearing

## 2019-07-21 NOTE — Progress Notes (Signed)
Chlamydia test return positive.  Pt will be treated in house with azithro x1 before discharge.  RN [employer] notified so nursery will know for baby.

## 2019-07-22 ENCOUNTER — Ambulatory Visit: Payer: Self-pay

## 2019-07-22 LAB — CULTURE, BETA STREP (GROUP B ONLY): Strep Gp B Culture: NEGATIVE

## 2019-07-22 LAB — SPECIMEN STATUS REPORT

## 2019-07-22 NOTE — Lactation Note (Signed)
This note was copied from a baby's chart. Lactation Consultation Note: Mother reports that she just finished feeding infant 30 ml of ebm.with a Dr Manson Passey bottle nipple.  Mother has another 25-30 ml sitting at the bedside. Mother is hoping to be discharged to day.   Mother is planning to take home a [facility] loaner pump. Mother will follow up with [facility].   Discussed collection , storage and transportation of breast milk. Reviewed page 71 of mother baby book .   Discussed treatment and prevention of engorgement. Discussed protection of milk supply and pumping regularly every 2-3 hours for 20 mins.   Encouraged mother to follow up with LC and to be proactive and involved with BFSG's.  Mother to continue to due STS. Mother is aware of available LC services at [facility], BFSG'S, OP Dept, and phone # for questions or concerns about breastfeeding.  Mother receptive to all teaching and plan of care.    Patient Name: Elizabeth Stuart [id number] Date: 07/22/2019 Reason for consult: Follow-up assessment   Maternal Data    Feeding Feeding Type: Breast Milk Nipple Type: Dr. Lorne Skeens  [facility] Score                   Interventions    Lactation Tools Discussed/Used     Consult Status      Stevan Born [facility] 07/22/2019, 10:12 AM

## 2019-07-26 ENCOUNTER — Telehealth (INDEPENDENT_AMBULATORY_CARE_PROVIDER_SITE_OTHER): Payer: Medicaid Other

## 2019-07-26 ENCOUNTER — Other Ambulatory Visit: Payer: Self-pay

## 2019-07-26 VITALS — BP 152/109 | HR 92

## 2019-07-26 DIAGNOSIS — Z0130 Encounter for examination of blood pressure without abnormal findings: Secondary | ICD-10-CM

## 2019-07-26 NOTE — Progress Notes (Addendum)
   NURSE VISIT- BLOOD PRESSURE CHECK  SUBJECTIVE:  Elizabeth Stuart is a 24 y.o. G33P0101 female  for BP check. She postpartum delivery on 07-18-2019. B/P 152/109 pulse 92.waited and recheck B/P 157/108.early this morning 135/104   postpartum: medicines: yes . Visual changes (seeing spots/double/blurred vision none    . Taking medicines as instructed  . Headaches  yes    OBJECTIVE:  BP (!) 152/109 (BP Location: Right Arm, Patient Position: Sitting, Cuff Size: Normal)   Pulse 92   Breastfeeding Yes .  ASSESSMENT:   blood pressure check  PLAN: Discussed with  Dr.john freguson  Recommendations take two more table of 2.5 mg, will equate 7.5 mg today. Will call additional prescription in      Follow-up: 1 week for B/P check  Postpartum visit 5-18 2021 [facility]  07/26/2019 10:02 AM

## 2019-07-27 ENCOUNTER — Other Ambulatory Visit: Payer: Self-pay | Admitting: Obstetrics and Gynecology

## 2019-07-27 MED ORDER — ENALAPRIL MALEATE 5 MG PO TABS: 5.0000 mg | ORAL_TABLET | Freq: Every day | ORAL | 2 refills | Status: AC

## 2019-07-27 NOTE — Progress Notes (Signed)
BP meds enalapril (Vasotec) incereased to 5 mg daily.

## 2019-07-28 ENCOUNTER — Ambulatory Visit (HOSPITAL_COMMUNITY): Payer: Medicaid Other

## 2019-07-28 ENCOUNTER — Telehealth: Payer: Self-pay | Admitting: Obstetrics and Gynecology

## 2019-07-28 NOTE — Telephone Encounter (Signed)
Error

## 2019-08-01 ENCOUNTER — Telehealth: Payer: Self-pay | Admitting: Women's Health

## 2019-08-01 ENCOUNTER — Telehealth: Payer: Self-pay | Admitting: *Deleted

## 2019-08-01 NOTE — Telephone Encounter (Signed)
Telephoned patient at home number and advised patient could use cool or ice packs for some relief. Also could use ibuprofen and cabbage leaves. Patient states will try this. Patient voiced understanding.

## 2019-08-01 NOTE — Telephone Encounter (Signed)
Pt would like to speak to a nurse to get advice on how to dry up her breast milk.

## 2019-08-02 ENCOUNTER — Ambulatory Visit (HOSPITAL_COMMUNITY): Payer: Medicaid Other

## 2019-08-02 ENCOUNTER — Other Ambulatory Visit: Payer: Self-pay

## 2019-08-02 ENCOUNTER — Telehealth: Payer: Medicaid Other

## 2019-08-07 NOTE — BH Specialist Note (Signed)
Integrated Behavioral Health via Telemedicine Video Visit  08/07/2019 Elizabeth Stuart [id number]  Number of Integrated Behavioral Health visits: 4 Session Start time: 1:50  Session End time: 2:09 Total time: 19  Referring Provider: Cam Hai, CNM Type of Visit: Video Patient/Family location: Home [facility] Provider location: [facility]  All persons participating in visit: Patient Elizabeth Stuart and [facility] Leianne Callins    Confirmed patient's address: Yes  Confirmed patient's phone number: Yes  Any changes to demographics: No   Confirmed patient's insurance: Yes  Any changes to patient's insurance: No   Discussed confidentiality: at previous visit  I connected with Elizabeth Stuart  by a video enabled telemedicine application and verified that I am speaking with the correct person using two identifiers.     I discussed the limitations of evaluation and management by telemedicine and the availability of in person appointments.  I discussed that the purpose of this visit is to provide behavioral health care while limiting exposure to the novel coronavirus.   Discussed there is a possibility of technology failure and discussed alternative modes of communication if that failure occurs.  I discussed that engaging in this video visit, they consent to the provision of behavioral healthcare and the services will be billed under their insurance.  Patient and/or legal guardian expressed understanding and consented to video visit: Yes   PRESENTING CONCERNS: Patient and/or family reports the following symptoms/concerns: Pt states she is adjusting well postpartum, her parents have been very supportive, she is eating and sleeping well, and has no other concerns at this time.  Duration of problem: postpartum; Severity of problem: n/a  STRENGTHS (Protective Factors/Coping Skills): Strong social support  GOALS ADDRESSED: Patient will: 1. Increase  education and/or ability of: healthy habits  1.  Demonstrate ability to: Increase healthy adjustment to current life circumstances  INTERVENTIONS: Interventions utilized:  Supportive Counseling Standardized Assessments completed: GAD-7 and PHQ 9  ASSESSMENT: Patient currently experiencing Psychosocial stress.   Patient may benefit from psychoeducation and brief therapeutic interventions regarding coping with adjustment to new motherhood.  Marland Kitchen  PLAN: 1. Follow up with behavioral health clinician on : As needed 2. Behavioral recommendations:  -Continue taking prenatal vitamin daily until postpartum visit -Continue allowing family to provide support postpartum -Continue prioritizing healthy sleeping and eating during postpartum period 3. Referral(s): Integrated [employer] (In Clinic)  I discussed the assessment and treatment plan with the patient and/or parent/guardian. They were provided an opportunity to ask questions and all were answered. They agreed with the plan and demonstrated an understanding of the instructions.   They were advised to call back or seek an in-person evaluation if the symptoms worsen or if the condition fails to improve as anticipated.  Valetta Close Branon Sabine

## 2019-08-08 ENCOUNTER — Other Ambulatory Visit: Payer: Self-pay

## 2019-08-08 ENCOUNTER — Ambulatory Visit (INDEPENDENT_AMBULATORY_CARE_PROVIDER_SITE_OTHER): Payer: Medicaid Other | Admitting: Clinical

## 2019-08-08 DIAGNOSIS — O99345 Other mental disorders complicating the puerperium: Secondary | ICD-10-CM | POA: Diagnosis not present

## 2019-08-08 DIAGNOSIS — F99 Mental disorder, not otherwise specified: Secondary | ICD-10-CM

## 2019-08-08 DIAGNOSIS — Z658 Other specified problems related to psychosocial circumstances: Secondary | ICD-10-CM

## 2019-08-08 NOTE — Patient Instructions (Signed)
Center for [facility] at [facility] [address] [place], [place] [address] [phone]

## 2019-08-22 ENCOUNTER — Other Ambulatory Visit: Payer: Self-pay

## 2019-08-22 ENCOUNTER — Encounter: Payer: Self-pay | Admitting: Women's Health

## 2019-08-22 ENCOUNTER — Ambulatory Visit (INDEPENDENT_AMBULATORY_CARE_PROVIDER_SITE_OTHER): Payer: Medicaid Other | Admitting: Women's Health

## 2019-08-22 ENCOUNTER — Other Ambulatory Visit (HOSPITAL_COMMUNITY)
Admission: RE | Admit: 2019-08-22 | Discharge: 2019-08-22 | Disposition: A | Discharge status: Home / Self Care | Payer: Medicaid Other | Source: Ambulatory Visit | Attending: Obstetrics and Gynecology | Admitting: Obstetrics and Gynecology

## 2019-08-22 DIAGNOSIS — A749 Chlamydial infection, unspecified: Secondary | ICD-10-CM

## 2019-08-22 DIAGNOSIS — O165 Unspecified maternal hypertension, complicating the puerperium: Secondary | ICD-10-CM

## 2019-08-22 DIAGNOSIS — Z8759 Personal history of other complications of pregnancy, childbirth and the puerperium: Secondary | ICD-10-CM

## 2019-08-22 NOTE — Progress Notes (Signed)
POSTPARTUM VISIT Patient name: Elizabeth Stuart MRN [id number]  Date of birth: [birth year] Chief Complaint:   Postpartum Care  History of Present Illness:   Elizabeth Stuart is a 24 y.o. G74P0101 African American female being seen today for a postpartum visit. She is 5 weeks postpartum following a spontaneous vaginal delivery at 35.3 gestational weeks after IOL for FGR w/ intermittent AEDF. GHTN dx on admission. Anesthesia: epidural.  I have fully reviewed the prenatal and intrapartum course. She was d/c'd on enalapril 2.'5mg'$  po. Treated for +CT while in hospitalPregnancy complicated by spontaneous triplets w/ demise of 2 fetuses early in pregnancy, FGR, GHTN. Postpartum course has been complicated by PPHTN on elapril 56m didn't take this am. Bleeding no bleeding. Bowel function is normal. Bladder function is normal.  Patient is not sexually active. Last sexual activity: prior to birth of baby.    No LMP recorded.  Baby's course has been uncomplicated. Baby is feeding by breast now bottle    Edinburgh Postpartum Depression Screening:  Edinburgh Postnatal Depression Scale - 08/22/19 1010      Edinburgh Postnatal Depression Scale:  In the Past 7 Days   I have been able to laugh and see the funny side of things.  0    I have looked forward with enjoyment to things.  0    I have blamed myself unnecessarily when things went wrong.  0    I have been anxious or worried for no good reason.  0    I have felt scared or panicky for no good reason.  0    Things have been getting on top of me.  1    I have been so unhappy that I have had difficulty sleeping.  0    I have felt sad or miserable.  0    I have been so unhappy that I have been crying.  0    The thought of harming myself has occurred to me.  0    Edinburgh Postnatal Depression Scale Total  1      Review of Systems:   Pertinent items are noted in HPI Denies Abnormal vaginal discharge w/ itching/odor/irritation, headaches, visual changes,  shortness of breath, chest pain, abdominal pain, severe nausea/vomiting, or problems with urination or bowel movements. Pertinent History Reviewed:  Reviewed past medical,surgical, obstetrical and family history.  Reviewed problem list, medications and allergies. OB History  Gravida Para Term Preterm AB Living  1 1 0 1 0 1  SAB TAB Ectopic Multiple Live Births  0 0 0 0 1    # Outcome Date GA Lbr Len/2nd Weight Sex Delivery Anes PTL Lv  1 Preterm 07/19/19 [id number] / 00:08 4 lb 8 oz (2.04 kg) M Vag-Spont EPI  LIV   Physical Assessment:   Vitals:   08/22/19 1005 08/22/19 1012  BP: (!) 133/91 133/90  Pulse: (!) 104 (!) 108  Weight: 238 lb 6.4 oz (108.1 kg)   Height: '5\' 3"'$  (1.6 m)   Body mass index is 42.23 kg/m.       Physical Examination:   General appearance: alert, well appearing, and in no distress  Mental status: alert, oriented to person, place, and time  Skin: warm & dry   Cardiovascular: normal heart rate noted   Respiratory: normal respiratory effort, no distress   Breasts: deferred, no complaints   Abdomen: soft, non-tender   Pelvic: VULVA: normal appearing vulva with no masses, tenderness or lesions  Rectal: no hemorrhoids  Extremities:  no edema       No results found for this or any previous visit (from the past 24 hour(s)).  Assessment & Plan:  1) Postpartum exam 2) 5 wks s/p SVB after IOL for FGR w/ intermittent AEDF @ 35wks, also dx w/ GHTN on admission 3) Bottlefeeding 4) Depression screening 5) Contraception counseling>has rx for micronor (was breastfeeding, not now), hasn't started taking 6) PPHTN> on enalapril '5mg'$ , didn't take this am, to come back this week for bp check w/ nurse (take enalapril at least 1hr before visit)-if bp normal can switch to COCs-will need rx. Then 3wks for bp check w/ nurse-stop enalapril 2d before visit 7) Recent +CT>tx in hospital, POC today   Essential components of care per ACOG recommendations:  1.  Mood and well being:    . Patient with negative depression screening today. . Pre-existing mental health disorders? No  . Patient does use tobacco-smokes 3 cigarettes/day. If using tobacco we discussed reduction/cessation and risk of relapse . Substance use disorder? No   2. Infant care and feeding:  . Patient currently breastfeeding? No  . Childcare strategy if returning to work/school: her parents . Infant has a pediatrician/family doctor? Yes  . Recommended that all caregivers be immunized for flu, pertussis and other preventable communicable diseases . Pt does have material needs met such as stable housing, utilities, food and diapers. If not, referred to local resources for help obtaining these.  3. Sexuality, contraception and birth spacing . Provided guidance regarding sexuality, management of dyspareunia, and resumption of intercourse . Patient does not want a pregnancy in the future.  Desired family size is 1 children.  . Discussed avoiding interpregnancy interval <49mhs and recommended birth spacing of 18 months . Reviewed forms of contraception. Patient desires COCs.    4. Sleep and fatigue . Discussed coping options for fatigue and sleep disruption . Encouraged family/partner/community support of 4 hrs of uninterrupted sleep to help with mood and fatigue  5. Physical recovery  . Pt did not have a cesarean section. If yes, assessed incisional pain and providing guidance on normal vs prolonged recovery . Patient had a 1st degree laceration, perineal healing and pain reviewed.  . Patient has urinary incontinence? No, fecal incontinence? No  . Patient is safe to resume physical activity.  . Discussed attainment of healthy weight.  6.  Chronic disease management . Discussed pregnancy complications if any, and their implications for future childbearing and long-term maternal health. . Review recommendations for prevention of recurrent pregnancy complications, such as 17 hydroxyprogesterone caproate to  reduce risk for recurrent PTB not applicable, or aspirin to reduce risk of preeclampsia yes. . Pt had GDM: No. . Reviewed medications and non-pregnant dosing including consideration of whether pt is breastfeeding using a reliable resource such as LactMed: not applicable . Referred for f/u w/ PCP or subspecialist providers as indicated: not applicable  7. Health maintenance . [place] . Mammogram at 23yo or earlier if indicated  Meds: No orders of the defined types were placed in this encounter.   Follow-up: Return for this week for bp check w/RN in office, then 3wks for same, then 335ms for f/u w/ CNM in office.   No orders of the defined types were placed in this encounter.   [facility]/18/2021 10:56 AM

## 2019-08-22 NOTE — Patient Instructions (Addendum)
STOP TAKING BP MED 2 DAYS BEFORE YOUR VISIT IN 3 WEEKS  Oral Contraception Use Oral contraceptive pills (OCPs) are medicines that you take to prevent pregnancy. OCPs work by:  Preventing the ovaries from releasing eggs.  Thickening mucus in the lower part of the uterus (cervix), which prevents sperm from entering the uterus.  Thinning the lining of the uterus (endometrium), which prevents a fertilized egg from attaching to the endometrium. OCPs are highly effective when taken exactly as prescribed. However, OCPs do not prevent sexually transmitted infections (STIs). Safe sex practices, such as using condoms while on an OCP, can help prevent STIs. Before taking OCPs, you may have a physical exam, blood test, and Pap test. A Pap test involves taking a sample of cells from your cervix to check for cancer. Discuss with your health care provider the possible side effects of the OCP you may be prescribed. When you start an OCP, be aware that it can take 2-3 months for your body to adjust to changes in hormone levels. How to take oral contraceptive pills Follow instructions from your health care provider about how to start taking your first cycle of OCPs. Your health care provider may recommend that you:  Start the pill on day 1 of your menstrual period. If you start at this time, you will not need any backup form of birth control (contraception), such as condoms.  Start the pill on the first Sunday after your menstrual period or on the day you get your prescription. In these cases, you will need to use backup contraception for the first week.  Start the pill at any time of your cycle. ? If you take the pill within 5 days of the start of your period, you will not need a backup form of contraception. ? If you start at any other time of your menstrual cycle, you will need to use another form of contraception for 7 days. If your OCP is the type called a minipill, it will protect you from pregnancy after  taking it for 2 days (48 hours), and you can stop using backup contraception after that time. After you have started taking OCPs:  If you forget to take 1 pill, take it as soon as you remember. Take the next pill at the regular time.  If you miss 2 or more pills, call your health care provider. Different pills have different instructions for missed doses. Use backup birth control until your next menstrual period starts.  If you use a 28-day pack that contains inactive pills and you miss 1 of the last 7 pills (pills with no hormones), throw away the rest of the non-hormone pills and start a new pill pack. No matter which day you start the OCP, you will always start a new pack on that same day of the week. Have an extra pack of OCPs and a backup contraceptive method available in case you miss some pills or lose your OCP pack. Follow these instructions at home:  Do not use any products that contain nicotine or tobacco, such as cigarettes and e-cigarettes. If you need help quitting, ask your health care provider.  Always use a condom to protect against STIs. OCPs do not protect against STIs.  Use a calendar to mark the days of your menstrual period.  Read the information and directions that came with your OCP. Talk to your health care provider if you have questions. Contact a health care provider if:  You develop nausea and vomiting.  You have abnormal vaginal discharge or bleeding.  You develop a rash.  You miss your menstrual period. Depending on the type of OCP you are taking, this may be a sign of pregnancy. Ask your health care provider for more information.  You are losing your hair.  You need treatment for mood swings or depression.  You get dizzy when taking the OCP.  You develop acne after taking the OCP.  You become pregnant or think you may be pregnant.  You have diarrhea, constipation, and abdominal pain or cramps.  You miss 2 or more pills. Get help right away  if:  You develop chest pain.  You develop shortness of breath.  You have an uncontrolled or severe headache.  You develop numbness or slurred speech.  You develop visual or speech problems.  You develop pain, redness, and swelling in your legs.  You develop weakness or numbness in your arms or legs. Summary  Oral contraceptive pills (OCPs) are medicines that you take to prevent pregnancy.  OCPs do not prevent sexually transmitted infections (STIs). Always use a condom to protect against STIs.  When you start an OCP, be aware that it can take 2-3 months for your body to adjust to changes in hormone levels.  Read all the information and directions that come with your OCP. This information is not intended to replace advice given to you by your health care provider. Make sure you discuss any questions you have with your health care provider. Document Revised: 07/15/2018 Document Reviewed: 05/04/2016 Elsevier Patient Education  2020 [employer].

## 2019-08-23 LAB — CERVICOVAGINAL ANCILLARY ONLY
Chlamydia: NEGATIVE
Comment: NEGATIVE
Comment: NORMAL
Neisseria Gonorrhea: NEGATIVE

## 2019-08-29 ENCOUNTER — Ambulatory Visit (INDEPENDENT_AMBULATORY_CARE_PROVIDER_SITE_OTHER): Payer: Medicaid Other | Admitting: *Deleted

## 2019-08-29 ENCOUNTER — Encounter: Payer: Self-pay | Admitting: *Deleted

## 2019-08-29 DIAGNOSIS — Z0130 Encounter for examination of blood pressure without abnormal findings: Secondary | ICD-10-CM

## 2019-08-29 NOTE — Progress Notes (Addendum)
   NURSE VISIT- BLOOD PRESSURE CHECK  SUBJECTIVE:  Elizabeth Stuart is a 24 y.o. G55P0101 female here for BP check. She is postpartum, delivery date 07/19/19    HYPERTENSION ROS:  Postpartum:  . Severe headaches that don't go away with tylenol/other medicines: No  . Visual changes (seeing spots/double/blurred vision) No  . Severe pain under right breast breast or in center of upper chest No  . Severe nausea/vomiting No  . Taking medicines as instructed yes   OBJECTIVE:  BP 128/84 (BP Location: Right Arm, Patient Position: Sitting, Cuff Size: Normal)   Pulse (!) 102   Wt 230 lb 9.6 oz (104.6 kg)   Breastfeeding No   BMI 40.85 kg/m   Appearance alert, well appearing, and in no distress and oriented to person, place, and time.  ASSESSMENT: Postpartum  blood pressure check  PLAN: Discussed with Dr. Emelda Fear   Recommendations: stop medicine 2 days before next visit   Follow-up: as scheduled   Jobe Marker  08/29/2019 4:39 PM   Chart reviewed for nurse visit. Agree with plan of care.  Cheral Marker, [place] 08/30/2019 9:38 AM

## 2019-09-14 ENCOUNTER — Encounter: Payer: Self-pay | Admitting: *Deleted

## 2019-09-14 ENCOUNTER — Ambulatory Visit (INDEPENDENT_AMBULATORY_CARE_PROVIDER_SITE_OTHER): Payer: Medicaid Other | Admitting: *Deleted

## 2019-09-14 VITALS — BP 128/88 | HR 89

## 2019-09-14 DIAGNOSIS — O165 Unspecified maternal hypertension, complicating the puerperium: Secondary | ICD-10-CM

## 2019-09-14 MED ORDER — LO LOESTRIN FE 1 MG-10 MCG / 10 MCG PO TABS: 1.0000 | ORAL_TABLET | Freq: Every day | ORAL | 12 refills | Status: AC

## 2019-09-14 NOTE — Progress Notes (Signed)
   NURSE VISIT- BLOOD PRESSURE CHECK  SUBJECTIVE:  Elizabeth Stuart is a 24 y.o. G4P0101 female here for BP check. She is postpartum, delivery date 07/19/19    HYPERTENSION ROS:  Pregnant/postpartum:  . Severe headaches that don't go away with tylenol/other medicines: No  . Visual changes (seeing spots/double/blurred vision) No  . Severe pain under right breast breast or in center of upper chest No  . Severe nausea/vomiting No  . Taking medicines as instructed not applicable   OBJECTIVE:  BP 128/88 (BP Location: Right Arm, Patient Position: Sitting, Cuff Size: Large)   Pulse 89   Breastfeeding No   Appearance alert, well appearing, and in no distress.  ASSESSMENT: Postpartum  blood pressure check  PLAN: Discussed with Cyril Mourning, AGNP   Recommendations: no changes needed   Follow-up: as scheduled   Annamarie Dawley  09/14/2019 10:18 AM

## 2019-09-14 NOTE — Addendum Note (Signed)
Addended by: Cyril Mourning A on: 09/14/2019 01:00 PM   Modules accepted: Orders

## 2019-09-14 NOTE — Progress Notes (Signed)
Chart reviewed for nurse visit. Agree with plan of care. Will rx Lo leostrin, can start after finishing Micronor  Adline Potter, NP 09/14/2019 12:59 PM

## 2019-11-22 ENCOUNTER — Ambulatory Visit: Payer: Medicaid Other | Admitting: Women's Health

## 2020-01-24 ENCOUNTER — Ambulatory Visit: Payer: Self-pay | Admitting: Adult Health

## 2020-02-02 IMAGING — US US MFM OB DETAIL+14 WK
1 series · 12 of 28 positions shown · non-contrast
Comparison: none

[Series 1: us mfm ob detail+14 wk · 78 acquisitions, 12 frames shown]
[im 3/78]
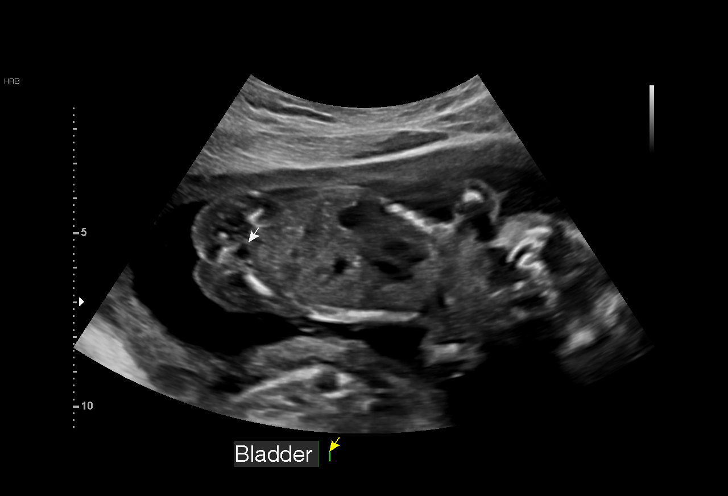
[im 9/78]
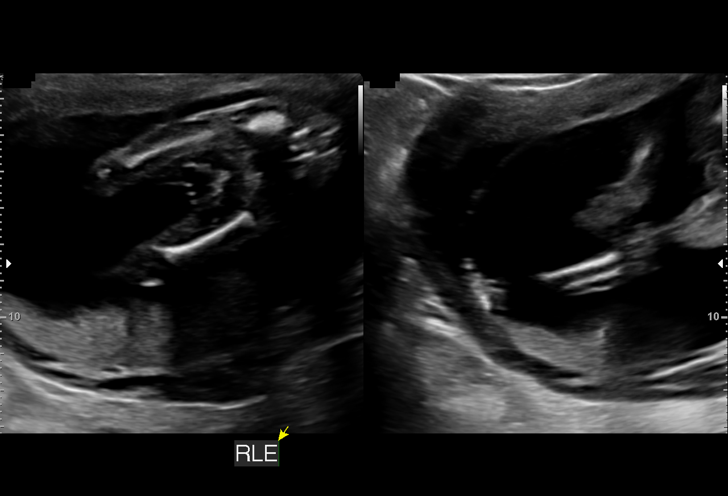
[im 15/78]
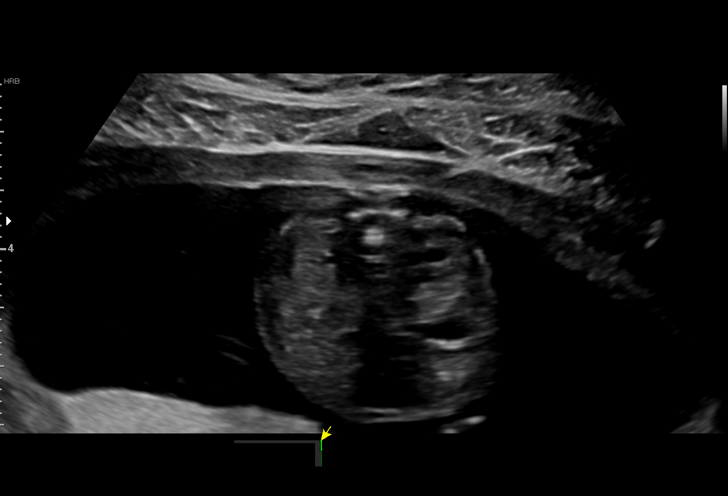
[im 23/78]
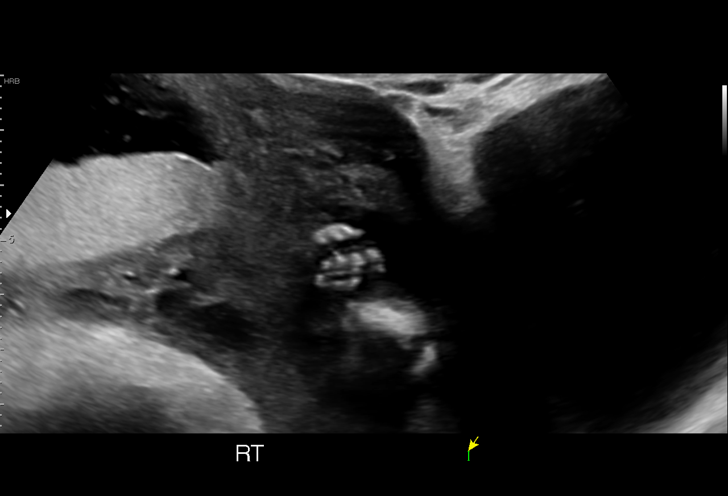
[im 29/78]
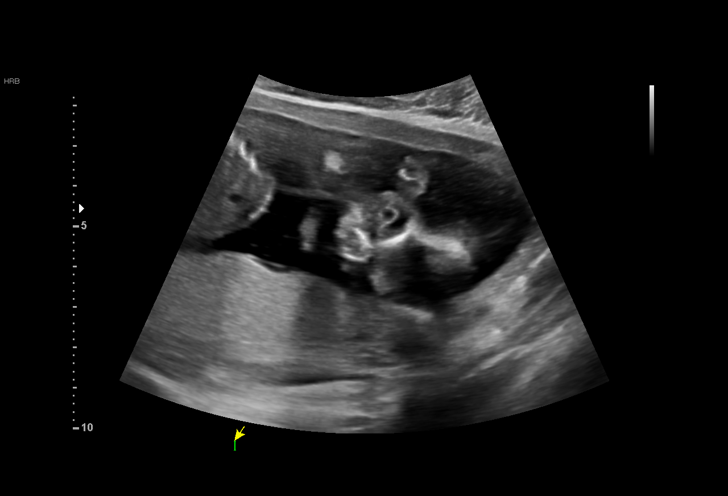
[im 35/78]
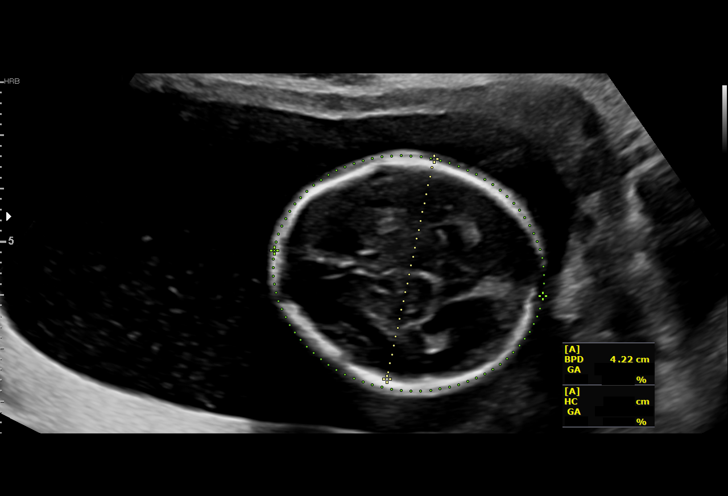
[im 43/78]
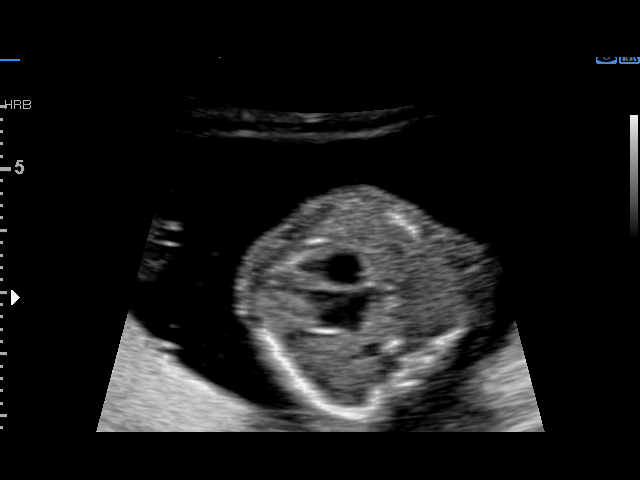
[im 49/78]
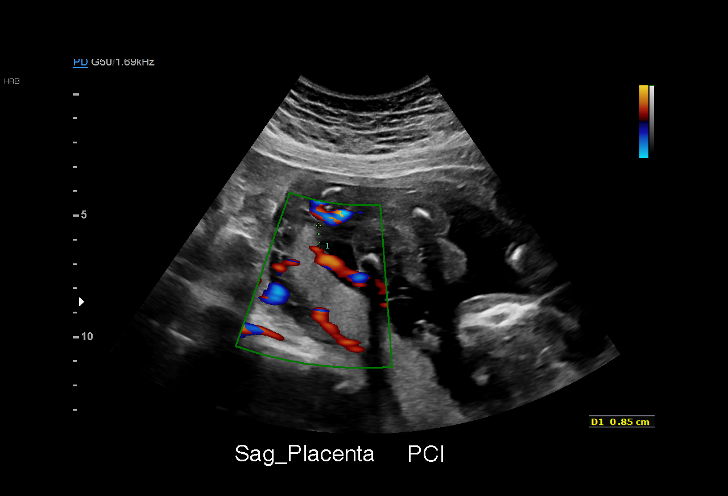
[im 55/78]
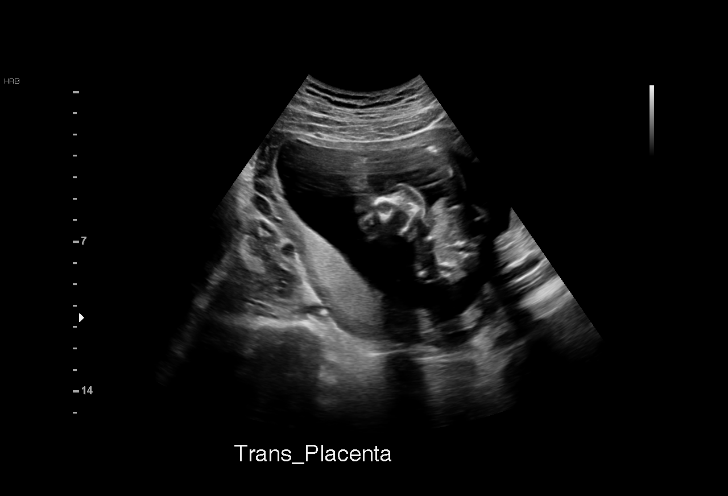
[im 63/78]
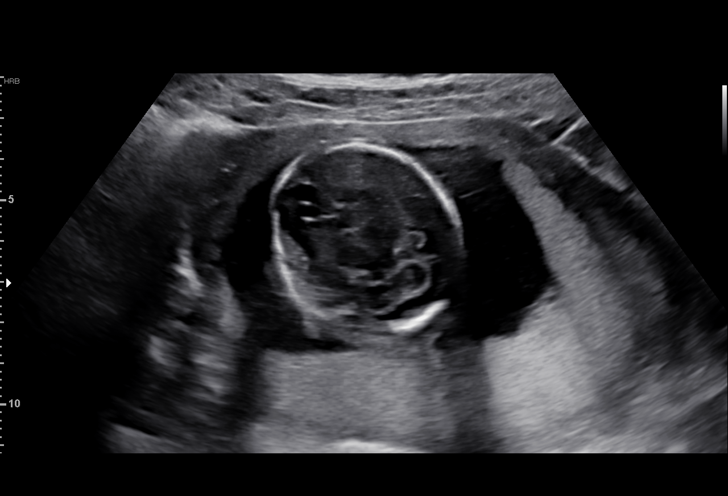
[im 69/78]
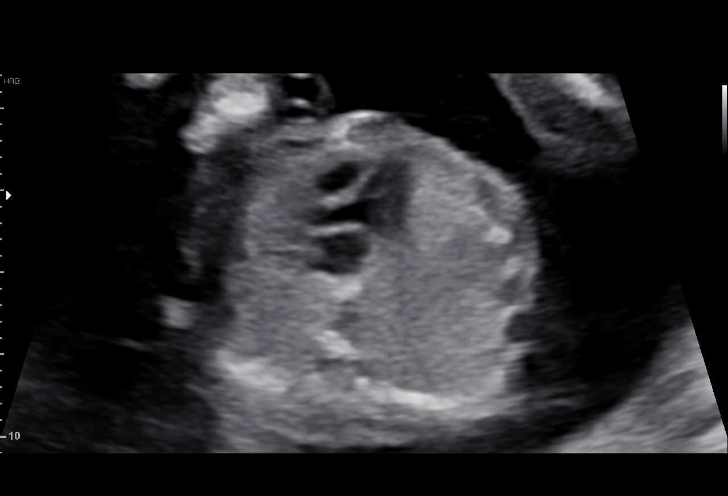
[im 75/78]
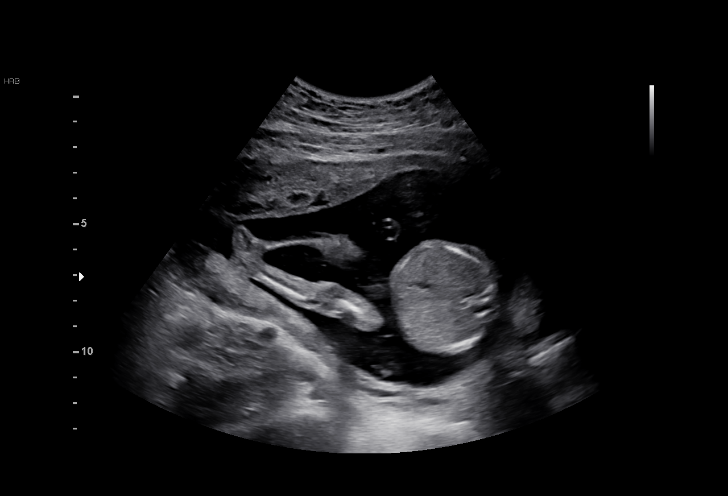

[12 of 28 positions shown; findings below may reference images not displayed]

Name:       NIKOLA TERKI SINANOVIC                   Visit Date: 03/20/2019 [DATE]

                                                            [address]

 ----------------------------------------------------------------------

 ----------------------------------------------------------------------
Indications

  Triplet gestation
  Fetal demise less than 22 weeks (Triplet A
  AND C demise)
  18 weeks gestation of pregnancy
  Encounter for antenatal screening for
  malformations
 ----------------------------------------------------------------------
Vital Signs

 BMI:
Fetal Evaluation (Fetus A)

 Num Of Fetuses:         3
 Cardiac Activity:       Absent
OB History

 Gravidity:    1         Term:   0        Prem:   0        SAB:   0
 TOP:          0       Ectopic:  0        Living: 0
Gestational Age (Fetus A)

 LMP:           18w 1d        Date:  11/13/18                 EDD:   08/20/19
 Best:          18w 1d     Det. By:  Early Ultrasound         EDD:   08/20/19
                                     (01/10/19)

Fetal Evaluation (Fetus B)

 Num Of Fetuses:         3
 Fetal Heart Rate(bpm):  141
 Cardiac Activity:       Observed
 Presentation:           Cephalic
 Placenta:               Posterior
 P. Cord Insertion:      Marginal insertion

 Amniotic Fluid
 AFI FV:      Within normal limits

                             Largest Pocket(cm)

Biometry (Fetus B)

 BPD:      42.1  mm     G. Age:  18w 5d         76  %    CI:        79.92   %    70 - 86
                                                         FL/HC:      18.2   %    15.8 - 18
 HC:      148.8  mm     G. Age:  18w 0d         34  %    HC/AC:      1.14        1.07 -
 AC:      130.4  mm     G. Age:  18w 4d         62  %    FL/BPD:     64.4   %
 FL:       27.1  mm     G. Age:  18w 2d         49  %    FL/AC:      20.8   %    20 - 24

 Est. FW:     238  gm      0 lb 8 oz     61  %     FW Discordancy      0 \ 0 %
Gestational Age (Fetus B)

 LMP:           18w 1d        Date:  11/13/18                 EDD:   08/20/19
 U/S Today:     18w 3d                                        EDD:   08/18/19
 Best:          18w 1d     Det. By:  Early Ultrasound         EDD:   08/20/19
                                     (01/10/19)
Anatomy (Fetus B)

 Cranium:               Appears normal         Aortic Arch:            Not well visualized
 Cavum:                 Appears normal         Ductal Arch:            Not well visualized
 Ventricles:            Appears normal         Diaphragm:              Appears normal
 Choroid Plexus:        Appears normal         Stomach:                Appears normal, left
                                                                       sided
 Cerebellum:            Appears normal         Abdomen:                Appears normal
 Posterior Fossa:       Appears normal         Abdominal Wall:         Appears nml (cord
                                                                       insert, abd wall)
 Nuchal Fold:           Appears normal         Cord Vessels:           Appears normal (3
                                                                       vessel cord)
 Face:                  Profile nl; orbits not Kidneys:                Appear normal
                        well visualized
 Lips:                  Appears normal         Bladder:                Appears normal
 Thoracic:              Appears normal         Spine:                  Appears normal
 Heart:                 Not well visualized    Upper Extremities:      Appears normal
 RVOT:                  Not well visualized    Lower Extremities:      Appears normal
 LVOT:                  Appears normal

 Other:  Male gender. Heels and 5th digit visualized. Technically difficult due
         to fetal position.

Fetal Evaluation (Fetus C)

 Num Of Fetuses:         3
 Cardiac Activity:       Absent
Gestational Age (Fetus C)

 LMP:           18w 1d        Date:  11/13/18                 EDD:   08/20/19
 Best:          18w 1d     Det. By:  Early Ultrasound         EDD:   08/20/19
                                     (01/10/19)
Cervix Uterus Adnexa

 Cervix
 Length:           3.29  cm.
 Normal appearance by transabdominal scan.

 Uterus
 No abnormality visualized.

 Left Ovary
 No adnexal mass visualized.

 Right Ovary
 No adnexal mass visualized.

 Cul De Sac
 No free fluid seen.

 Adnexa
 No abnormality visualized.
Impression

 This patient was seen for a detailed fetal anatomy scan as
 this pregnancy initially started out as a trichorionic, triamniotic
 triplet gestation.  Unfortunately, two of the fetuses demised
 earlier in her pregnancy.  The patient denies any problems
 since her last exam.

 Due to a triplet gestation with two fetal demises, the patient
 did not have any screening tests for fetal aneuploidy drawn in
 her current pregnancy.

 The fetal growth and amniotic fluid level appeared
 appropriate for her gestational age.  There were no obvious
 fetal anomalies noted today.  However, today's exam was
 limited due to her early gestational age.
 The remnants of one of the previously demised fetus was
 visible today.

 A possible marginal placental cord insertion was noted today.
 The small association between a marginal cord insertion and
 fetal growth issues later in her pregnancy was discussed.
 We will continue to follow her with serial growth ultrasounds
 due to this finding.

 A follow-up exam was scheduled in 4 weeks to obtain better
 views of the fetal anatomy and to follow the fetal growth.

## 2021-09-12 ENCOUNTER — Encounter (HOSPITAL_COMMUNITY): Payer: Self-pay | Admitting: Emergency Medicine

## 2021-09-12 ENCOUNTER — Emergency Department (HOSPITAL_COMMUNITY)
Admission: EM | Admit: 2021-09-12 | Discharge: 2021-09-12 | Disposition: A | Discharge status: Home / Self Care | Payer: Medicaid Other | Attending: Emergency Medicine | Admitting: Emergency Medicine

## 2021-09-12 ENCOUNTER — Other Ambulatory Visit: Payer: Self-pay

## 2021-09-12 DIAGNOSIS — R0602 Shortness of breath: Secondary | ICD-10-CM | POA: Diagnosis present

## 2021-09-12 DIAGNOSIS — Z91010 Allergy to peanuts: Secondary | ICD-10-CM | POA: Insufficient documentation

## 2021-09-12 DIAGNOSIS — J45901 Unspecified asthma with (acute) exacerbation: Secondary | ICD-10-CM | POA: Insufficient documentation

## 2021-09-12 MED ORDER — PREDNISONE 50 MG PO TABS: ORAL_TABLET | ORAL | 0 refills | Status: AC

## 2021-09-12 MED ORDER — PREDNISONE 50 MG PO TABS
60.0000 mg | ORAL_TABLET | Freq: Once | ORAL | Status: AC
  Administered 2021-09-12: 60 mg via ORAL
  Filled 2021-09-12: qty 1

## 2021-09-12 MED ORDER — ALBUTEROL SULFATE HFA 108 (90 BASE) MCG/ACT IN AERS
4.0000 | INHALATION_SPRAY | Freq: Once | RESPIRATORY_TRACT | Status: AC
Start: 2021-09-12 — End: 2021-09-12
  Administered 2021-09-12: 4 via RESPIRATORY_TRACT
  Filled 2021-09-12: qty 6.7

## 2021-09-12 MED ORDER — ALBUTEROL SULFATE HFA 108 (90 BASE) MCG/ACT IN AERS: 2.0000 | INHALATION_SPRAY | Freq: Four times a day (QID) | RESPIRATORY_TRACT | 2 refills | Status: AC | PRN

## 2021-09-12 NOTE — Discharge Instructions (Addendum)
Return if any problems.

## 2021-09-12 NOTE — ED Triage Notes (Signed)
Pt c/o sob this am, son just had URI. Wheezing throughout lung fields noted. No obvious sob/resp distress noted.

## 2021-09-12 NOTE — ED Provider Notes (Signed)
[facility] EMERGENCY DEPARTMENT Provider Note   CSN: [id number] Arrival date & time: 09/12/21  1639     History  Chief Complaint  Patient presents with   Shortness of Breath    Elizabeth Stuart is a 26 y.o. female.  Patient complains of an asthma attack.  Patient reports her child recently had an upper respiratory infection.  Patient reports she is currently out of her inhaler.  Patient has had to be on prednisone in the past when she has exacerbations of asthma  The history is provided by the patient. No language interpreter was used.  Shortness of Breath Severity:  Moderate Onset quality:  Gradual Timing:  Constant Progression:  Worsening Chronicity:  New Relieved by:  Nothing Worsened by:  Nothing Ineffective treatments:  None tried Associated symptoms: wheezing   Associated symptoms: no chest pain and no fever   Risk factors: no recent alcohol use        Home Medications Prior to Admission medications   Medication Sig Start Date End Date Taking? Authorizing Provider  albuterol (VENTOLIN HFA) 108 (90 Base) MCG/ACT inhaler Inhale 2 puffs into the lungs every 6 (six) hours as needed for wheezing or shortness of breath. Patient not taking: Reported on 08/29/2019 05/18/19   Cresenzo-Dishmon, Joaquim Lai, CNM  enalapril (VASOTEC) 5 MG tablet Take 1 tablet (5 mg total) by mouth daily. Patient not taking: Reported on 09/14/2019 07/27/19   Jonnie Kind, MD  ibuprofen (ADVIL) 600 MG tablet Take 1 tablet (600 mg total) by mouth every 6 (six) hours. Patient not taking: Reported on 08/29/2019 07/21/19   Cresenzo-Dishmon, Joaquim Lai, CNM  norethindrone [facility]) 0.35 MG tablet Start on the Sunday after baby turns 42 weeks old 07/20/19   Cresenzo-Dishmon, Joaquim Lai, CNM  Norethindrone-Ethinyl Estradiol-Fe Biphas (LO LOESTRIN FE) 1 MG-10 MCG / 10 MCG tablet Take 1 tablet by mouth daily. Take 1 daily by mouth 09/14/19   Estill Dooms, NP  Prenatal Vit-Fe Fumarate-FA (PRENATAL MULTIVITAMIN)  TABS tablet Take 1 tablet by mouth daily at 12 noon. Patient not taking: Reported on 09/14/2019    [provider]      Allergies    Apple juice, Cucumber extract, Dust mite extract, Other, Peanuts [peanut oil], and Tomato    Review of Systems   Review of Systems  Constitutional:  Negative for fever.  Respiratory:  Positive for shortness of breath and wheezing.   Cardiovascular:  Negative for chest pain.  All other systems reviewed and are negative.   Physical Exam Updated Vital Signs BP 124/80 (BP Location: Right Arm)   Pulse 95   Temp 100.2 F (37.9 C) (Oral)   Resp 19   LMP 09/11/2021 (Exact Date)   SpO2 97%  Physical Exam Vitals reviewed.  Constitutional:      Appearance: She is well-developed.  Cardiovascular:     Rate and Rhythm: Normal rate and regular rhythm.  Pulmonary:     Effort: Pulmonary effort is normal.     Breath sounds: Examination of the right-upper field reveals wheezing. Examination of the left-upper field reveals wheezing. Examination of the right-middle field reveals wheezing. Examination of the left-middle field reveals wheezing. Examination of the right-lower field reveals wheezing. Examination of the left-lower field reveals wheezing. Wheezing present.  Musculoskeletal:        General: Normal range of motion.     Cervical back: Normal range of motion.  Skin:    General: Skin is warm.  Neurological:     General: No focal deficit  present.     Mental Status: She is alert.  Psychiatric:        Mood and Affect: Mood normal.     ED Results / Procedures / Treatments   Labs (all labs ordered are listed, but only abnormal results are displayed) Labs Reviewed - No data to display  EKG None  Radiology No results found.  Procedures Procedures    Medications Ordered in ED Medications  predniSONE (DELTASONE) tablet 60 mg (60 mg Oral Given 09/12/21 1755)  albuterol (VENTOLIN HFA) 108 (90 Base) MCG/ACT inhaler 4 puff (4 puffs Inhalation  Given 09/12/21 1756)    ED Course/ Medical Decision Making/ A&P                           Medical Decision Making Risk Prescription drug management.   Pt given albuterol inhaler 4 puffs here.  Prednsone 60 mg po          Final Clinical Impression(s) / ED Diagnoses Final diagnoses:  Moderate asthma with exacerbation, unspecified whether persistent    Rx / DC Orders ED Discharge Orders          Ordered    predniSONE (DELTASONE) 50 MG tablet        09/12/21 1839    albuterol (VENTOLIN HFA) 108 (90 Base) MCG/ACT inhaler  Every 6 hours PRN        09/12/21 1839           An After Visit Summary was printed and given to the patient.    Sidney Ace 09/12/21 1840    Luna Fuse, MD 09/22/21 [phone]

## 2024-01-31 ENCOUNTER — Other Ambulatory Visit: Payer: Self-pay

## 2024-01-31 ENCOUNTER — Emergency Department (HOSPITAL_COMMUNITY)
Admission: EM | Admit: 2024-01-31 | Discharge: 2024-01-31 | Disposition: A | Discharge status: Home / Self Care | Payer: Self-pay | Attending: Emergency Medicine | Admitting: Emergency Medicine

## 2024-01-31 ENCOUNTER — Encounter (HOSPITAL_COMMUNITY): Payer: Self-pay

## 2024-01-31 ENCOUNTER — Emergency Department (HOSPITAL_COMMUNITY): Payer: Self-pay

## 2024-01-31 DIAGNOSIS — J181 Lobar pneumonia, unspecified organism: Secondary | ICD-10-CM | POA: Insufficient documentation

## 2024-01-31 DIAGNOSIS — Z91010 Allergy to peanuts: Secondary | ICD-10-CM | POA: Insufficient documentation

## 2024-01-31 DIAGNOSIS — D72829 Elevated white blood cell count, unspecified: Secondary | ICD-10-CM | POA: Insufficient documentation

## 2024-01-31 DIAGNOSIS — J189 Pneumonia, unspecified organism: Secondary | ICD-10-CM

## 2024-01-31 LAB — RESP PANEL BY RT-PCR (RSV, FLU A&B, COVID)  RVPGX2
Influenza A by PCR: NEGATIVE
Influenza B by PCR: NEGATIVE
Resp Syncytial Virus by PCR: NEGATIVE
SARS Coronavirus 2 by RT PCR: NEGATIVE

## 2024-01-31 LAB — COMPREHENSIVE METABOLIC PANEL WITH GFR
ALT: 10 U/L (ref 0–44)
AST: 13 U/L — ABNORMAL LOW (ref 15–41)
Albumin: 4.6 g/dL (ref 3.5–5.0)
Alkaline Phosphatase: 61 U/L (ref 38–126)
Anion gap: 14 (ref 5–15)
BUN: 6 mg/dL (ref 6–20)
CO2: 22 mmol/L (ref 22–32)
Calcium: 9.4 mg/dL (ref 8.9–10.3)
Chloride: 99 mmol/L (ref 98–111)
Creatinine, Ser: 0.92 mg/dL (ref 0.44–1.00)
GFR, Estimated: >60 mL/min (ref >60)
Glucose, Bld: 107 mg/dL — ABNORMAL HIGH (ref 70–99)
Potassium: 3.6 mmol/L (ref 3.5–5.1)
Sodium: 135 mmol/L (ref 135–145)
Total Bilirubin: 0.8 mg/dL (ref 0.0–1.2)
Total Protein: 8 g/dL (ref 6.5–8.1)

## 2024-01-31 LAB — CBC WITH DIFFERENTIAL/PLATELET
Abs Immature Granulocytes: 0.8 K/uL — ABNORMAL HIGH (ref 0.00–0.07)
Basophils Absolute: 0 K/uL (ref 0.0–0.1)
Basophils Relative: 0 %
Eosinophils Absolute: 0 K/uL (ref 0.0–0.5)
Eosinophils Relative: 0 %
HCT: 36.1 % (ref 36.0–46.0)
Hemoglobin: 12.5 g/dL (ref 12.0–15.0)
Lymphocytes Relative: 4 %
Lymphs Abs: 1 K/uL (ref 0.7–4.0)
MCH: 29 pg (ref 26.0–34.0)
MCHC: 34.6 g/dL (ref 30.0–36.0)
MCV: 83.8 fL (ref 80.0–100.0)
Metamyelocytes Relative: 3 %
Monocytes Absolute: 1.3 K/uL — ABNORMAL HIGH (ref 0.1–1.0)
Monocytes Relative: 5 %
Neutro Abs: 22.3 K/uL — ABNORMAL HIGH (ref 1.7–7.7)
Neutrophils Relative %: 88 %
Platelets: 375 K/uL (ref 150–400)
RBC: 4.31 MIL/uL (ref 3.87–5.11)
RDW: 13.7 % (ref 11.5–15.5)
Smear Review: NORMAL
WBC: 25.3 K/uL — ABNORMAL HIGH (ref 4.0–10.5)
nRBC: 0 % (ref 0.0–0.2)

## 2024-01-31 LAB — PREGNANCY, URINE: Preg Test, Ur: NEGATIVE

## 2024-01-31 LAB — URINALYSIS, ROUTINE W REFLEX MICROSCOPIC
Bacteria, UA: NONE SEEN
Bilirubin Urine: NEGATIVE
Glucose, UA: NEGATIVE mg/dL
Ketones, ur: NEGATIVE mg/dL
Leukocytes,Ua: NEGATIVE
Nitrite: NEGATIVE
Protein, ur: NEGATIVE mg/dL
Specific Gravity, Urine: 1.003 — ABNORMAL LOW (ref 1.005–1.030)
pH: 6 (ref 5.0–8.0)

## 2024-01-31 LAB — GROUP A STREP BY PCR: Group A Strep by PCR: NOT DETECTED

## 2024-01-31 LAB — MONONUCLEOSIS SCREEN: Mono Screen: NEGATIVE

## 2024-01-31 MED ORDER — SODIUM CHLORIDE 0.9 % IV SOLN
500.0000 mg | Freq: Once | INTRAVENOUS | Status: AC
  Administered 2024-01-31: 500 mg via INTRAVENOUS
  Filled 2024-01-31: qty 5

## 2024-01-31 MED ORDER — SODIUM CHLORIDE 0.9 % IV SOLN
1.0000 g | Freq: Once | INTRAVENOUS | Status: AC
  Administered 2024-01-31: 1 g via INTRAVENOUS
  Filled 2024-01-31: qty 10

## 2024-01-31 MED ORDER — ACETAMINOPHEN 500 MG PO TABS
1000.0000 mg | ORAL_TABLET | Freq: Once | ORAL | Status: AC
  Administered 2024-01-31: 1000 mg via ORAL
  Filled 2024-01-31: qty 2

## 2024-01-31 MED ORDER — CEFDINIR 300 MG PO CAPS: 300.0000 mg | ORAL_CAPSULE | Freq: Two times a day (BID) | ORAL | 0 refills | Status: AC

## 2024-01-31 MED ORDER — KETOROLAC TROMETHAMINE 15 MG/ML IJ SOLN
15.0000 mg | Freq: Once | INTRAMUSCULAR | Status: AC
  Administered 2024-01-31: 15 mg via INTRAVENOUS
  Filled 2024-01-31: qty 1

## 2024-01-31 MED ORDER — METHOCARBAMOL 500 MG PO TABS
750.0000 mg | ORAL_TABLET | Freq: Once | ORAL | Status: AC
  Administered 2024-01-31: 750 mg via ORAL
  Filled 2024-01-31: qty 2

## 2024-01-31 MED ORDER — ONDANSETRON HCL 4 MG/2ML IJ SOLN
4.0000 mg | Freq: Once | INTRAMUSCULAR | Status: AC
  Administered 2024-01-31: 4 mg via INTRAVENOUS
  Filled 2024-01-31: qty 2

## 2024-01-31 MED ORDER — AZITHROMYCIN 250 MG PO TABS: 250.0000 mg | ORAL_TABLET | Freq: Every day | ORAL | 0 refills | Status: AC

## 2024-01-31 MED ORDER — SODIUM CHLORIDE 0.9 % IV BOLUS
1000.0000 mL | Freq: Once | INTRAVENOUS | Status: AC
  Administered 2024-01-31: 1000 mL via INTRAVENOUS

## 2024-01-31 NOTE — ED Triage Notes (Signed)
 Pt to er, pt states that yesterday she took a nap and then woke up with back pain, fever, chills, abd pain, and she tried to eat something and vomited.  States that today she started having a headache.  States that her son is also sick.

## 2024-01-31 NOTE — ED Notes (Signed)
 Pt given warm blanket.

## 2024-01-31 NOTE — ED Provider Notes (Signed)
 [place] EMERGENCY DEPARTMENT AT [facility] Provider Note   CSN: [id number] Arrival date & time: 01/31/24  9176     Patient presents with: Fever   Elizabeth Stuart is a 28 y.o. female.   Patient is a 28 year old female who presents to the emergency department with a chief complaint of generalized malaise and fatigue, body aches, fever, chills.  She notes that yesterday she developed abdominal pain and back pain and had associated vomiting.  She has had no associated diarrhea.  She does admit to a mild cough.  She was exposed to her son who was sick with hand-foot-and-mouth.  She has had a headache today as well.  She denies any pain to her neck or back.  She has had no associated abnormal rashes.   Fever Associated symptoms: chills, nausea and vomiting        Prior to Admission medications   Medication Sig Start Date End Date Taking? Authorizing Provider  albuterol  (VENTOLIN  HFA) 108 (90 Base) MCG/ACT inhaler Inhale 2 puffs into the lungs every 6 (six) hours as needed for wheezing or shortness of breath. 09/12/21   Sofia, Leslie K, PA-C  enalapril  (VASOTEC ) 5 MG tablet Take 1 tablet (5 mg total) by mouth daily. Patient not taking: Reported on 09/14/2019 07/27/19   Edsel Norleen GAILS, MD  ibuprofen  (ADVIL ) 600 MG tablet Take 1 tablet (600 mg total) by mouth every 6 (six) hours. Patient not taking: Reported on 08/29/2019 07/21/19   Cresenzo-Dishmon, Cathlean, CNM  norethindrone  (MICRONOR ) 0.35 MG tablet Start on the Sunday after baby turns 41 weeks old 07/20/19   Cresenzo-Dishmon, Cathlean, CNM  Norethindrone -Ethinyl Estradiol-Fe Biphas (LO LOESTRIN FE ) 1 MG-10 MCG / 10 MCG tablet Take 1 tablet by mouth daily. Take 1 daily by mouth 09/14/19   Signa Delon DELENA, NP  predniSONE  (DELTASONE ) 50 MG tablet One tablet a day 09/12/21   Sofia, Leslie K, PA-C  Prenatal Vit-Fe Fumarate-FA (PRENATAL MULTIVITAMIN) TABS tablet Take 1 tablet by mouth daily at 12 noon. Patient not taking: Reported on  09/14/2019    [provider]    Allergies: Apple juice, Cucumber extract, Dust mite extract, Other, Peanuts [peanut oil], and Tomato    Review of Systems  Constitutional:  Positive for chills, fatigue and fever.  Gastrointestinal:  Positive for abdominal pain, nausea and vomiting.  All other systems reviewed and are negative.   Updated Vital Signs BP 131/89 (BP Location: Right Arm)   Pulse (!) 112   Temp 100 F (37.8 C) (Oral)   Resp 18   Ht 5' 2 (1.575 m)   Wt 99.8 kg   SpO2 100%   BMI 40.24 kg/m   Physical Exam Vitals and nursing note reviewed.  Constitutional:      General: She is not in acute distress.    Appearance: Normal appearance. She is not ill-appearing.  HENT:     Head: Normocephalic and atraumatic.     Nose: Nose normal. No congestion or rhinorrhea.     Mouth/Throat:     Mouth: Mucous membranes are moist.     Pharynx: No oropharyngeal exudate or posterior oropharyngeal erythema.  Eyes:     Extraocular Movements: Extraocular movements intact.     Conjunctiva/sclera: Conjunctivae normal.     Pupils: Pupils are equal, round, and reactive to light.  Cardiovascular:     Rate and Rhythm: Normal rate and regular rhythm.     Pulses: Normal pulses.     Heart sounds: Normal heart sounds. No  murmur heard.    No gallop.  Pulmonary:     Effort: Pulmonary effort is normal. No respiratory distress.     Breath sounds: Normal breath sounds. No stridor. No wheezing, rhonchi or rales.  Abdominal:     General: Abdomen is flat. Bowel sounds are normal. There is no distension.     Palpations: Abdomen is soft.     Tenderness: There is no abdominal tenderness. There is no guarding.  Musculoskeletal:        General: Normal range of motion.     Cervical back: Normal range of motion and neck supple.  Skin:    General: Skin is warm and dry.     Findings: No bruising or rash.  Neurological:     General: No focal deficit present.     Mental Status: She is alert and  oriented to person, place, and time. Mental status is at baseline.  Psychiatric:        Mood and Affect: Mood normal.        Behavior: Behavior normal.        Thought Content: Thought content normal.        Judgment: Judgment normal.     (all labs ordered are listed, but only abnormal results are displayed) Labs Reviewed  CBC WITH DIFFERENTIAL/PLATELET - Abnormal; Notable for the following components:      Result Value   WBC 25.3 (*)    All other components within normal limits  RESP PANEL BY RT-PCR (RSV, FLU A&B, COVID)  [id number]  GROUP A STREP BY PCR  URINALYSIS, ROUTINE W REFLEX MICROSCOPIC  PREGNANCY, URINE  COMPREHENSIVE METABOLIC PANEL WITH GFR    EKG: None  Radiology: No results found.   Procedures   Medications Ordered in the ED  sodium chloride  0.9 % bolus 1,000 mL (has no administration in time range)  ketorolac (TORADOL) 15 MG/ML injection 15 mg (has no administration in time range)  acetaminophen  (TYLENOL ) tablet 1,000 mg (has no administration in time range)  ondansetron  (ZOFRAN ) injection 4 mg (has no administration in time range)    Clinical Course as of 01/31/24 1030  Mon Jan 31, 2024  0924 Mononucleosis screen [CR]    Clinical Course User Index [CR] Germaine Mitzie MALVA Jann                                 Medical Decision Making Amount and/or Complexity of Data Reviewed Labs: ordered. Radiology: ordered.  Risk OTC drugs. Prescription drug management.   This patient presents to the ED for concern of fever, myalgias differential diagnosis includes pneumonia, acute viral syndrome, strep pharyngitis, urinary tract infection, pyelonephritis    Additional history obtained:  Additional history obtained from none External records from outside source obtained and reviewed including none   Lab Tests:  I Ordered, and personally interpreted labs.  The pertinent results include: Leukocytosis, no anemia, normal kidney function liver  function, normal electrolytes, unremarkable urinalysis, negative strep test, negative respiratory panel and monotest   Imaging Studies ordered:  I ordered imaging studies including chest x-ray I independently visualized and interpreted imaging which showed subtle infiltrate I agree with the radiologist interpretation   Medicines ordered and prescription drug management:  I ordered medication including IV fluids, Toradol, Zofran , Tylenol , Rocephin, azithromycin  for pneumonia Reevaluation of the patient after these medicines showed that the patient improved I have reviewed the patients home medicines and have made adjustments as needed  Problem List / ED Course:  Patient is doing very well at this time and is stable for discharge home.  Discussed with patient that x-ray is consistent with a right sided pneumonia.  Patient does have stable vital signs at this point and tachycardia has resolved.  She has had no associated hypoxia.  Do not suspect that admission is warranted at this time.  Urinalysis demonstrates no indication of urinary tract infection.  Abdominal exam is benign at this time with no focal tenderness throughout and do not suspect an acute intra-abdominal surgical process.  Will continue outpatient treatment for her pneumonia and recommend close follow-up with primary care doctor to ensure resolution.  Strict precautions were provided for any new or worsening symptoms.  Patient voiced understanding and had no additional questions.   Social Determinants of Health:  None        Final diagnoses:  None    ED Discharge Orders     None          Daralene Lonni JONETTA DEVONNA 01/31/24 1032    Bernard Drivers, MD 02/02/24 [phone]

## 2024-01-31 NOTE — Discharge Instructions (Signed)
 Please take all antibiotics as directed.  He will not need to start taking his antibiotics till tomorrow as you have received today's dose.  Please follow-up closely with your primary care doctor to ensure resolution of the pneumonia.  Return to emergency department immediately for any new or worsening symptoms.
